# Patient Record
Sex: Male | Born: 1995 | Hispanic: Yes | Marital: Married | Smoking: Never smoker
Health system: Southern US, Community
[De-identification: ages and names within clinical notes are randomized; demographics above are authoritative.]

## PROBLEM LIST (undated history)

## (undated) DIAGNOSIS — R011 Cardiac murmur, unspecified: Secondary | ICD-10-CM

## (undated) DIAGNOSIS — I456 Pre-excitation syndrome: Secondary | ICD-10-CM

## (undated) HISTORY — PX: TONSILLECTOMY: SUR1361

## (undated) HISTORY — PX: ELECTROPHYSIOLOGIC STUDY: SHX172A

## (undated) HISTORY — PX: HAND SURGERY: SHX662

---

## 2014-04-18 ENCOUNTER — Emergency Department: Payer: Self-pay | Admitting: Emergency Medicine

## 2014-04-18 LAB — COMPREHENSIVE METABOLIC PANEL
ALK PHOS: 92 U/L
ALT: 21 U/L
Albumin: 4.7 g/dL (ref 3.8–5.6)
Anion Gap: 7 (ref 7–16)
BILIRUBIN TOTAL: 0.6 mg/dL (ref 0.2–1.0)
BUN: 11 mg/dL (ref 7–18)
Calcium, Total: 8.8 mg/dL — ABNORMAL LOW (ref 9.0–10.7)
Chloride: 106 mmol/L (ref 98–107)
Co2: 26 mmol/L (ref 21–32)
Creatinine: 0.81 mg/dL (ref 0.60–1.30)
EGFR (African American): >60
Glucose: 123 mg/dL — ABNORMAL HIGH (ref 65–99)
Osmolality: 278 (ref 275–301)
POTASSIUM: 3.4 mmol/L — AB (ref 3.5–5.1)
SGOT(AST): 28 U/L (ref 10–41)
Sodium: 139 mmol/L (ref 136–145)
Total Protein: 8.3 g/dL (ref 6.4–8.6)

## 2014-04-18 LAB — CBC
HCT: 45.4 % (ref 40.0–52.0)
HGB: 15.1 g/dL (ref 13.0–18.0)
MCH: 29.3 pg (ref 26.0–34.0)
MCHC: 33.3 g/dL (ref 32.0–36.0)
MCV: 88 fL (ref 80–100)
Platelet: 236 10*3/uL (ref 150–440)
RBC: 5.16 10*6/uL (ref 4.40–5.90)
RDW: 13.4 % (ref 11.5–14.5)
WBC: 9.4 10*3/uL (ref 3.8–10.6)

## 2014-04-18 LAB — TROPONIN I: Troponin-I: <0.02 ng/mL

## 2014-08-02 ENCOUNTER — Emergency Department (HOSPITAL_COMMUNITY)
Admission: EM | Admit: 2014-08-02 | Discharge: 2014-08-02 | Disposition: A | Discharge status: Home / Self Care | Payer: Worker's Compensation | Attending: Emergency Medicine | Admitting: Emergency Medicine

## 2014-08-02 ENCOUNTER — Encounter (HOSPITAL_COMMUNITY): Payer: Self-pay | Admitting: Emergency Medicine

## 2014-08-02 ENCOUNTER — Emergency Department (HOSPITAL_COMMUNITY): Payer: Worker's Compensation

## 2014-08-02 DIAGNOSIS — Y9389 Activity, other specified: Secondary | ICD-10-CM | POA: Insufficient documentation

## 2014-08-02 DIAGNOSIS — S134XXA Sprain of ligaments of cervical spine, initial encounter: Secondary | ICD-10-CM | POA: Diagnosis not present

## 2014-08-02 DIAGNOSIS — R011 Cardiac murmur, unspecified: Secondary | ICD-10-CM | POA: Diagnosis not present

## 2014-08-02 DIAGNOSIS — S199XXA Unspecified injury of neck, initial encounter: Secondary | ICD-10-CM

## 2014-08-02 DIAGNOSIS — W1839XA Other fall on same level, initial encounter: Secondary | ICD-10-CM | POA: Diagnosis not present

## 2014-08-02 DIAGNOSIS — Y9289 Other specified places as the place of occurrence of the external cause: Secondary | ICD-10-CM | POA: Diagnosis not present

## 2014-08-02 DIAGNOSIS — Y998 Other external cause status: Secondary | ICD-10-CM | POA: Insufficient documentation

## 2014-08-02 HISTORY — DX: Cardiac murmur, unspecified: R01.1

## 2014-08-02 MED ORDER — CYCLOBENZAPRINE HCL 10 MG PO TABS: 10.0000 mg | ORAL_TABLET | Freq: Two times a day (BID) | ORAL | Status: DC | PRN

## 2014-08-02 MED ORDER — MORPHINE SULFATE 4 MG/ML IJ SOLN
4.0000 mg | Freq: Once | INTRAMUSCULAR | Status: AC
  Administered 2014-08-02: 4 mg via INTRAMUSCULAR
  Filled 2014-08-02: qty 1

## 2014-08-02 NOTE — ED Notes (Signed)
Pt comes via [facility] from urgent care, reports recliner from unknown height and landed on top of head.  Pt denies LOC, dizziness, N/V.  Pt states pain 7/10.  Pt arrived in c-collar on longboard, MD cleared, pt stated point tenderness on palpation of neck.  NAD noted at this time.

## 2014-08-02 NOTE — Discharge Instructions (Signed)
Cervical Sprain °A cervical sprain is an injury in the neck in which the strong, fibrous tissues (ligaments) that connect your neck bones stretch or tear. Cervical sprains can range from mild to severe. Severe cervical sprains can cause the neck vertebrae to be unstable. This can lead to damage of the spinal cord and can result in serious nervous system problems. The amount of time it takes for a cervical sprain to get better depends on the cause and extent of the injury. Most cervical sprains heal in 1 to 3 weeks. °CAUSES  °Severe cervical sprains may be caused by:  °· Contact sport injuries (such as from football, rugby, wrestling, hockey, auto racing, gymnastics, diving, martial arts, or boxing).   °· Motor vehicle collisions.   °· Whiplash injuries. This is an injury from a sudden forward and backward whipping movement of the head and neck.  °· Falls.   °Mild cervical sprains may be caused by:  °· Being in an awkward position, such as while cradling a telephone between your ear and shoulder.   °· Sitting in a chair that does not offer proper support.   °· Working at a poorly designed computer station.   °· Looking up or down for long periods of time.   °SYMPTOMS  °· Pain, soreness, stiffness, or a burning sensation in the front, back, or sides of the neck. This discomfort may develop immediately after the injury or slowly, 24 hours or more after the injury.   °· Pain or tenderness directly in the middle of the back of the neck.   °· Shoulder or upper back pain.   °· Limited ability to move the neck.   °· Headache.   °· Dizziness.   °· Weakness, numbness, or tingling in the hands or arms.   °· Muscle spasms.   °· Difficulty swallowing or chewing.   °· Tenderness and swelling of the neck.   °DIAGNOSIS  °Most of the time your health care provider can diagnose a cervical sprain by taking your history and doing a physical exam. Your health care provider will ask about previous neck injuries and any known neck  problems, such as arthritis in the neck. X-rays may be taken to find out if there are any other problems, such as with the bones of the neck. Other tests, such as a CT scan or MRI, may also be needed.  °TREATMENT  °Treatment depends on the severity of the cervical sprain. Mild sprains can be treated with rest, keeping the neck in place (immobilization), and pain medicines. Severe cervical sprains are immediately immobilized. Further treatment is done to help with pain, muscle spasms, and other symptoms and may include: °· Medicines, such as pain relievers, numbing medicines, or muscle relaxants.   °· Physical therapy. This may involve stretching exercises, strengthening exercises, and posture training. Exercises and improved posture can help stabilize the neck, strengthen muscles, and help stop symptoms from returning.   °HOME CARE INSTRUCTIONS  °· Put ice on the injured area.   °¨ Put ice in a plastic bag.   °¨ Place a towel between your skin and the bag.   °¨ Leave the ice on for 15-20 minutes, 3-4 times a day.   °· If your injury was severe, you may have been given a cervical collar to wear. A cervical collar is a two-piece collar designed to keep your neck from moving while it heals. °¨ Do not remove the collar unless instructed by your health care provider. °¨ If you have long hair, keep it outside of the collar. °¨ Ask your health care provider before making any adjustments to your collar. Minor   adjustments may be required over time to improve comfort and reduce pressure on your chin or on the back of your head.  Ifyou are allowed to remove the collar for cleaning or bathing, follow your health care provider's instructions on how to do so safely.  Keep your collar clean by wiping it with mild soap and water and drying it completely. If the collar you have been given includes removable pads, remove them every 1-2 days and hand wash them with soap and water. Allow them to air dry. They should be completely  dry before you wear them in the collar.  If you are allowed to remove the collar for cleaning and bathing, wash and dry the skin of your neck. Check your skin for irritation or sores. If you see any, tell your health care provider.  Do not drive while wearing the collar.   Only take over-the-counter or prescription medicines for pain, discomfort, or fever as directed by your health care provider.   Keep all follow-up appointments as directed by your health care provider.   Keep all physical therapy appointments as directed by your health care provider.   Make any needed adjustments to your workstation to promote good posture.   Avoid positions and activities that make your symptoms worse.   Warm up and stretch before being active to help prevent problems.  SEEK MEDICAL CARE IF:   Your pain is not controlled with medicine.   You are unable to decrease your pain medicine over time as planned.   Your activity level is not improving as expected.  SEEK IMMEDIATE MEDICAL CARE IF:   You develop any bleeding.  You develop stomach upset.  You have signs of an allergic reaction to your medicine.   Your symptoms get worse.   You develop new, unexplained symptoms.   You have numbness, tingling, weakness, or paralysis in any part of your body.  MAKE SURE YOU:   Understand these instructions.  Will watch your condition.  Will get help right away if you are not doing well or get worse. Document Released: 05/02/2007 Document Revised: 07/10/2013 Document Reviewed: 01/10/2013 [facility] Patient Information 2015 [place], [place]. This information is not intended to replace advice given to you by your health care provider. Make sure you discuss any questions you have with your health care provider.  Cervical Collar A cervical collar is used to hold the head and neck still. This may be a soft, thick collar or a more rigid hard collar. This can keep your sore neck muscles from  hurting. The collar also protects you in case a more serious injury of the neck is present and can not be seen yet on an x-ray. FOLLOW THESE INSTRUCTIONS FOR COLLAR USE:  Attach the Velcro tab in back.  Make it tight enough to support part of the weight of your chin.  Do not make it so tight that it hurts or makes it hard to breathe.  Wear it until you are comfortable without it or as instructed. HOME CARE INSTRUCTIONS   Ice packs to the neck or areas of pain approximately 03-04 times a day for 15-20 minutes while awake. Do this for 2 days. Ask your physician if you may remove the cervical collar for bathing or ice.  Do not remove any collar unless instructed by a caregiver. Ask if the collar may be removed for showering or eating.  Only take over-the-counter or prescription medicines for pain, discomfort, or fever as directed by your caregiver.  Do not drive a car until given permission by your caregiver.  Follow all instructions for follow-up with your caregiver. This includes any referrals, physical therapy, and rehabilitation. Any delay in obtaining necessary care could result in a delay or failure of the injury to heal properly. SEEK IMMEDIATE MEDICAL CARE IF:   You develop increasing pain.  You develop problems using your arms or legs or have tingling or other funny feelings in them.  You develop loss of strength in either of your arms or legs or have difficulty walking.  You develop a fever or shaking chills.  You lose control of your bowels or bladder. MAKE SURE YOU:   Understand these instructions.  Will watch your condition.  Will get help right away if you are not doing well or get worse. Document Released: 03/27/2004 Document Revised: 09/27/2011 Document Reviewed: 02/26/2008 [facility] Patient Information 2015 [place]. This information is not intended to replace advice given to you by your health care provider. Make sure you discuss any questions you have  with your health care provider.

## 2014-08-02 NOTE — ED Provider Notes (Signed)
CSN: [id number]     Arrival date & time 08/02/14  1506 History   First MD Initiated Contact with Patient 08/02/14 1508     Chief Complaint  Patient presents with  . Neck Pain     (Consider location/radiation/quality/duration/timing/severity/associated sxs/prior Treatment) HPI Comments: Patient was moving furniture when a couch fell from approximately 2 feet above his head and landed directly on the top of his head.  He felt his neck compress toward his shoulders.    Patient is a 19 y.o. male presenting with neck pain. The history is provided by the patient. No language interpreter was used.  Neck Pain Pain location:  Generalized neck Quality:  Aching Pain radiates to:  Does not radiate Pain severity:  Moderate Pain is:  Same all the time Onset quality:  Sudden Duration:  5 hours Timing:  Constant Progression:  Unchanged Chronicity:  New Context comment:  Couch falling on head Relieved by:  Neck support Worsened by:  Twisting Ineffective treatments:  NSAIDs Associated symptoms: no bladder incontinence, no bowel incontinence, no fever, no headaches, no numbness and no weakness   Risk factors: no hx of head and neck radiation, no hx of spinal trauma and no recent head injury     Past Medical History  Diagnosis Date  . Heart murmur    Past Surgical History  Procedure Laterality Date  . Tonsillectomy    . Hand surgery Right     5th finger, 2012 ad 2013   No family history on file. History  Substance Use Topics  . Smoking status: Never Smoker   . Smokeless tobacco: Not on file  . Alcohol Use: No    Review of Systems  Constitutional: Negative for fever.  Gastrointestinal: Negative for bowel incontinence.  Genitourinary: Negative for bladder incontinence.  Musculoskeletal: Positive for neck pain.  Neurological: Negative for weakness, numbness and headaches.  All other systems reviewed and are negative.     Allergies  Review of patient's allergies indicates no  known allergies.  Home Medications   Prior to Admission medications   Medication Sig Start Date End Date Taking? Authorizing Provider  acetaminophen (TYLENOL) 325 MG tablet Take 650 mg by mouth every 6 (six) hours as needed for mild pain.   Yes Historical Provider, MD  cyclobenzaprine (FLEXERIL) 10 MG tablet Take 1 tablet (10 mg total) by mouth 2 (two) times daily as needed for muscle spasms. 08/02/14   Bethann Berkshire, MD   BP 107/59 mmHg  Pulse 75  Temp(Src) 98.4 F (36.9 C) (Oral)  Resp 16  Ht  (1.651 m)  Wt 112 lb (50.803 kg)  BMI 18.64 kg/m2  SpO2 98% Physical Exam  Constitutional: He is oriented to person, place, and time. He appears well-developed and well-nourished. No distress. Cervical collar and backboard in place.  HENT:  Head: Normocephalic.  Right Ear: Tympanic membrane normal.  Left Ear: Tympanic membrane normal.  Nose: No nasal deformity, septal deviation or nasal septal hematoma.  Eyes: Pupils are equal, round, and reactive to light.  Neck: No spinous process tenderness and no muscular tenderness present.  Cardiovascular: Normal rate, regular rhythm and normal heart sounds.   Pulmonary/Chest: Effort normal. No respiratory distress. He has no wheezes. He exhibits no tenderness, no laceration and no deformity.  Abdominal: Normal appearance. There is no tenderness. There is no rigidity, no rebound and no guarding.  Musculoskeletal:       Cervical back: He exhibits tenderness (C4-6). He exhibits no bony tenderness, no deformity, no  laceration and no pain.       Thoracic back: He exhibits no tenderness, no bony tenderness, no swelling, no deformity and no laceration.       Lumbar back: He exhibits no tenderness, no bony tenderness, no deformity and no laceration.  Neurological: He is alert and oriented to person, place, and time. He has normal strength. No cranial nerve deficit or sensory deficit. He displays no Babinski's sign on the right side. He displays no  Babinski's sign on the left side.  Strength 5/5 bilateral upper and lower extremities.  Sensation intact x4 extremities.    Skin: Skin is warm and dry. He is not diaphoretic.  Nursing note and vitals reviewed.   ED Course  Procedures (including critical care time) Labs Review Labs Reviewed - No data to display  Imaging Review Ct Cervical Spine Wo Contrast  08/02/2014   CLINICAL DATA:  Larey SeatFell backwards and struck head, neck tenderness  EXAM: CT CERVICAL SPINE WITHOUT CONTRAST  TECHNIQUE: Multidetector CT imaging of the cervical spine was performed without intravenous contrast. Multiplanar CT image reconstructions were also generated.  COMPARISON:  Cervical spine radiographs 07/29/2009  FINDINGS: Prevertebral soft tissues normal thickness.  Vertebral body and disc space heights maintained.  Osseous mineralization normal.  Visualized skullbase intact.  No acute fracture, subluxation or bone destruction.  Lung apices clear.  IMPRESSION: Normal exam.   Electronically Signed   By: Ulyses SouthwardMark  Boles M.D.   On: 08/02/2014 16:44     EKG Interpretation None      MDM   Final diagnoses:  Neck injury  Sprain of ligaments of cervical spine, initial encounter   Patient is a 19 year old Hispanic male with a pertinent past medical history who comes to the ED today as transfer from urgent care with concerns for potential C-spine injury. Physical exam as above. Patient does have C-spine tenderness as result of CT of the C-spine was obtained. He has no thoracic or lumbar spine tenderness. He has no weakness, numbness, or pathologic reflexes. Patient's mechanism of injury is concerning. CT of the C-spine demonstrated no acute fractures, subluxations, or soft tissue abnormalities. Patient was reevaluated after obtaining C-spine CT scan. He was still found to have midline tenderness and inability to rotate his neck 45 either direction. As a result he was unable to be cleared via [place] C-spine or nexus criteria.  Patient has no neurologic deficits as a result I do not feel he requires an emergent MRI at this time. He was placed into a [place] J collar and instructed to follow-up with his primary care physician or workman's comp physician in 2 weeks to obtain a repeat exam. He was instructed to return to the emergency department with weakness, numbness, bowel or bladder incontinence, or worsening pain. He was provided a prescription for Flexeril for muscle spasms. Patient was discharged in good condition imaging was reviewed by myself and consider medical decision-making. Imaging was interpreted by radiology. Care was discussed with attending Dr. Anitra LauthPlunkett.     Bethann BerkshireAaron Shreyansh Tiffany, MD 08/02/14 1711  Gwyneth SproutWhitney Plunkett, MD 08/02/14 2342

## 2018-05-04 DIAGNOSIS — I456 Pre-excitation syndrome: Secondary | ICD-10-CM | POA: Insufficient documentation

## 2018-09-03 ENCOUNTER — Other Ambulatory Visit: Payer: Self-pay

## 2018-09-03 ENCOUNTER — Emergency Department
Admission: EM | Admit: 2018-09-03 | Discharge: 2018-09-04 | Disposition: A | Discharge status: Home / Self Care | Payer: BC Managed Care – PPO | Attending: Emergency Medicine | Admitting: Emergency Medicine

## 2018-09-03 DIAGNOSIS — R531 Weakness: Secondary | ICD-10-CM | POA: Diagnosis present

## 2018-09-03 DIAGNOSIS — J45909 Unspecified asthma, uncomplicated: Secondary | ICD-10-CM | POA: Diagnosis not present

## 2018-09-03 DIAGNOSIS — R202 Paresthesia of skin: Secondary | ICD-10-CM | POA: Insufficient documentation

## 2018-09-03 DIAGNOSIS — M5412 Radiculopathy, cervical region: Secondary | ICD-10-CM

## 2018-09-03 MED ORDER — SODIUM CHLORIDE 0.9 % IV BOLUS
1000.0000 mL | Freq: Once | INTRAVENOUS | Status: AC
  Administered 2018-09-04: 1000 mL via INTRAVENOUS

## 2018-09-03 NOTE — ED Provider Notes (Signed)
[facility] Emergency Department Provider Note   ____________________________________________   First MD Initiated Contact with Patient 09/03/18 2342     (approximate)  I have reviewed the triage vital signs and the nursing notes.   HISTORY  Chief Complaint Numbness    HPI Henry Garcia is a 23 y.o. male who presents to the ED from home with a chief complaint of left upper extremity numbness, weakness and temperature change since 08/18/2018.   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  This was done at [facility].  Right groin was accessed.  Patient return to the ED the following day due to bleeding from the wound which was not actually bleeding.  Tells me since the day of the procedure, he has been experiencing occasional numbness, weakness and cold sensation in his left upper extremity.  There is no pattern to the symptoms.  He puts a blanket over his arm which then returns is warm to normal temperature.  He is right-hand dominant.  He has a postop follow-up appointment scheduled on March 17.  For some reason, patient has not called his cardiologist nor made an appointment for evaluation.  Tells me he figured he should just come to the ED for evaluation.  Denies recent fever, chills, headache, neck pain, vision changes, chest pain, shortness of breath, abdominal pain, nausea, vomiting.  Works as a [employer].   Past Medical History:  Diagnosis Date  . Heart murmur   . Acid reflux   . Arrhythmia   . Asthma   . Heart murmur   . Myocardial infarction (CMS-HCC)    2015    There are no active problems to display for this patient.   Past Surgical History:  Procedure Laterality Date  . HAND SURGERY Right    5th finger, 2012 ad 2013  . TONSILLECTOMY      Prior to Admission medications   Medication Sig Start Date End Date Taking? Authorizing Provider  acetaminophen (TYLENOL) 325 MG tablet Take 650 mg by mouth  every 6 (six) hours as needed for mild pain.    [provider]  cyclobenzaprine (FLEXERIL) 10 MG tablet Take 1 tablet (10 mg total) by mouth 2 (two) times daily as needed for muscle spasms. 08/02/14   Bethann Berkshire, MD    Allergies Amoxicillin; Clarithromycin; and Cortisone  No family history on file.  Social History Social History   Tobacco Use  . Smoking status: Never Smoker  Substance Use Topics  . Alcohol use: No  . Drug use: No    Review of Systems  Constitutional: No fever/chills Eyes: No visual changes. ENT: No sore throat. Cardiovascular: Denies chest pain. Respiratory: Denies shortness of breath. Gastrointestinal: No abdominal pain.  No nausea, no vomiting.  No diarrhea.  No constipation. Genitourinary: Negative for dysuria. Musculoskeletal: Positive for left upper extremity weakness, numbness and temperature change.  Negative for back pain. Skin: Negative for rash. Neurological: Negative for headaches, focal weakness or numbness.   ____________________________________________   PHYSICAL EXAM:  VITAL SIGNS: ED Triage Vitals  Enc Vitals Group     BP 09/03/18 2108 127/78     Pulse Rate 09/03/18 2108 85     Resp 09/03/18 2108 16     Temp 09/03/18 2108 98.3 F (36.8 C)     Temp Source 09/03/18 2108 Oral     SpO2 09/03/18 2108 99 %     Weight 09/03/18 2107 130 lb (59 kg)  Height 09/03/18 2107 5\' 5"  (1.651 m)     Head Circumference --      Peak Flow --      Pain Score 09/03/18 2106 6     Pain Loc --      Pain Edu? --      Excl. in [place]? --     Constitutional: Alert and oriented. Well appearing and in no acute distress. Eyes: Conjunctivae are normal. PERRL. EOMI. Head: Atraumatic. Nose: No congestion/rhinnorhea. Mouth/Throat: Mucous membranes are moist.  Oropharynx non-erythematous. Neck: No stridor.  No cervical spine tenderness to palpation.  Supple neck without meningismus.  No carotid bruits. Cardiovascular: Normal rate, regular rhythm.  Grossly normal heart sounds.  Good peripheral circulation. Respiratory: Normal respiratory effort.  No retractions. Lungs CTAB. Gastrointestinal: Soft and nontender. No distention. No abdominal bruits. No CVA tenderness. Musculoskeletal:  LUE: Full range of motion of shoulder, elbow and wrist.  No swelling to suggest DVT.  2+ radial pulse.  Brisk, less than 5-second capillary refill.  5/5 motor strength and sensation.  Symmetrically warm limbs without evidence for ischemia. No lower extremity tenderness nor edema.  No joint effusions. Neurologic:  Normal speech and language. No gross focal neurologic deficits are appreciated. No gait instability. Skin:  Skin is warm, dry and intact. No rash noted. Psychiatric: Mood and affect are normal. Speech and behavior are normal.  ____________________________________________   LABS (all labs ordered are listed, but only abnormal results are displayed)  Labs Reviewed  BASIC METABOLIC PANEL - Abnormal; Notable for the following components:      Result Value   Glucose, Bld 107 (*)    All other components within normal limits  CBC WITH DIFFERENTIAL/PLATELET  TROPONIN I   ____________________________________________  EKG  ED ECG REPORT I, Derriana Oser J, the attending physician, personally viewed and interpreted this ECG.   Date: 09/04/2018  EKG Time: 2109  Rate: 84  Rhythm: normal EKG, normal sinus rhythm  Axis: Normal  Intervals:Short PR  ST&T Change: Nonspecific  ____________________________________________  RADIOLOGY  ED MD interpretation: No acute cardiopulmonary process; CTA neck and left upper extremity unremarkable  Official radiology report(s): Dg Chest 2 View  Result Date: 09/04/2018 CLINICAL DATA:  23 year old male status post cardiac ablation for Wolff-Parkinson-White 2 weeks ago. Left arm pain. EXAM: CHEST - 2 VIEW COMPARISON:  CTA chest 11/29/2017 and earlier. FINDINGS: Lung volumes and mediastinal contours remain normal.  Visualized tracheal air column is within normal limits. Both lungs appear stable and clear. No pneumothorax or pleural effusion. No osseous abnormality identified. Negative visible bowel gas pattern. IMPRESSION: Stable and negative radiographic appearance of the chest. Electronically Signed   By: Odessa Fleming M.D.   On: 09/04/2018 00:24   Ct Angio Neck W And/or Wo Contrast  Result Date: 09/04/2018 CLINICAL DATA:  Initial evaluation for intermittent left arm numbness since cardiac ablation on 08/17/2018. EXAM: CT ANGIOGRAPHY NECK TECHNIQUE: Multidetector CT imaging of the neck was performed using the standard protocol during bolus administration of intravenous contrast. Multiplanar CT image reconstructions and MIPs were obtained to evaluate the vascular anatomy. Carotid stenosis measurements (when applicable) are obtained utilizing NASCET criteria, using the distal internal carotid diameter as the denominator. CONTRAST:  ISOVUE-370 IOPAMIDOL (ISOVUE-370) INJECTION 76% COMPARISON:  None. FINDINGS: Aortic arch: Visualized aortic arch of normal caliber with normal branch pattern. No stenosis or other abnormality about the origin of the great vessels. Visualized subclavian arteries widely patent and normal. Right carotid system: Right common and internal carotid arteries widely patent  without stenosis, dissection, or occlusion. Right external carotid artery and its branches within normal limits. Left carotid system: Left common and internal carotid arteries widely patent without stenosis, dissection, or occlusion. Left external carotid artery and its branches within normal limits. Vertebral arteries: Both of the vertebral arteries arise from the subclavian arteries. Vertebral arteries widely patent within the neck without stenosis, dissection, or occlusion. Skeleton: No acute osseous abnormality. No discrete lytic or blastic osseous lesions. Other neck: No other acute soft tissue abnormality within the neck. No  adenopathy. Salivary glands normal. Thyroid normal. Upper chest: Visualized upper chest demonstrates no acute finding. Partially visualized lungs are clear. IMPRESSION: Normal CTA of the neck with no acute vascular abnormality identified. Electronically Signed   By: Rise Mu M.D.   On: 09/04/2018 01:52   Ct Angio Up Extrem Left W &/or Wo Contast  Result Date: 09/04/2018 CLINICAL DATA:  Left arm numbness off and on since heart surgery on 08/18/2018. EXAM: CT ANGIOGRAPHY UPPER LEFT EXTREMITY TECHNIQUE: CT of the left upper extremity aortic arch through left hand with reformats. CONTRAST:  ISOVUE-370 IOPAMIDOL (ISOVUE-370) INJECTION 76% COMPARISON:  None. FINDINGS: Satisfactory opacification of aortic arch without aneurysmal dilatation or dissection. No significant stenosis is noted of the left subclavian, axillary and brachial arteries. The bifurcation of the brachial artery is unremarkable with visualization of the radial, ulnar and interosseous arteries to a least a mid forearm before timing of contrast obscures visualization of the distal radial and ulnar arteries. There is visualization the palmar arch. No significant stenosis, aneurysm or dissection. The included arm and forearm musculature and osseous elements are intact. Review of the MIP images confirms the above findings. IMPRESSION: Unremarkable CTA of the left upper extremity. Electronically Signed   By: Tollie Eth M.D.   On: 09/04/2018 01:42    ____________________________________________   PROCEDURES  Procedure(s) performed: None  Procedures  Critical Care performed: No  ____________________________________________   INITIAL IMPRESSION / ASSESSMENT AND PLAN / ED COURSE  As part of my medical decision making, I reviewed the following data within the electronic MEDICAL RECORD NUMBER History obtained from family, Nursing notes reviewed and incorporated, Labs reviewed, EKG interpreted, Old chart reviewed, Radiograph  reviewed and Notes from prior ED visits    23 year old male who presents with left upper extremity numbness, weakness and coldness status post EP procedure with ablation on 08/18/2018.  Differential diagnosis includes but is not limited to musculoskeletal, thoracic outlet syndrome, arterial occlusion, electrolyte imbalance, carotid dissection, etc.  Patient has a normal physical exam.  Tells me he is not having symptoms currently of numbness, weakness nor coldness.  Given his symptoms, will proceed with work-up to include lab work and CTA imaging of neck and left upper arm.   Clinical Course as of Sep 04 602  Mon Sep 04, 2018  0221 Updated patient on all results.  Currently he is not experiencing any discomfort in his left arm.  Will try him on Medrol Dosepak for some symptoms suggestive of cervical radiculopathy.  Do not feel this would trigger his WPW, especially status post ablation.  Strongly encouraged him to follow-up with his cardiologist next week.  Strict return precautions given.  Patient and family member verbalized understanding and agree with plan of care.   [JS]  0237 Noted cortisone listed as 1 of patient's allergies.  He describes having itching to cortisone injections.  But he has never had oral prednisone before.  Would hesitate to prescribe this in case he has a  reaction.  Will hold off for now.   [JS]    Clinical Course User Index [JS] Irean HongSung, Sanjit Mcmichael J, MD     ____________________________________________   FINAL CLINICAL IMPRESSION(S) / ED DIAGNOSES  Final diagnoses:  Paresthesia  Cervical radiculopathy     ED Discharge Orders    None       Note:  This document was prepared using Dragon voice recognition software and may include unintentional dictation errors.   Irean HongSung, Izamar Linden J, MD 09/04/18 [id number]

## 2018-09-03 NOTE — ED Notes (Signed)
Spoke with Dr. Derrill Kay regarding patient, no further orders at this time.

## 2018-09-03 NOTE — ED Triage Notes (Signed)
Reports left arm numbness off/on since heart surgery  August 18 2018.  Patient stated told his cardiologist and was told it was normal.

## 2018-09-04 ENCOUNTER — Emergency Department: Payer: BC Managed Care – PPO

## 2018-09-04 ENCOUNTER — Encounter: Payer: Self-pay | Admitting: Radiology

## 2018-09-04 LAB — CBC WITH DIFFERENTIAL/PLATELET
ABS IMMATURE GRANULOCYTES: 0.03 10*3/uL (ref 0.00–0.07)
Basophils Absolute: 0 10*3/uL (ref 0.0–0.1)
Basophils Relative: 1 %
Eosinophils Absolute: 0.3 10*3/uL (ref 0.0–0.5)
Eosinophils Relative: 3 %
HEMATOCRIT: 40.1 % (ref 39.0–52.0)
Hemoglobin: 14 g/dL (ref 13.0–17.0)
Immature Granulocytes: 0 %
LYMPHS PCT: 32 %
Lymphs Abs: 2.8 10*3/uL (ref 0.7–4.0)
MCH: 29.5 pg (ref 26.0–34.0)
MCHC: 34.9 g/dL (ref 30.0–36.0)
MCV: 84.6 fL (ref 80.0–100.0)
MONOS PCT: 10 %
Monocytes Absolute: 0.9 10*3/uL (ref 0.1–1.0)
NEUTROS PCT: 54 %
Neutro Abs: 4.7 10*3/uL (ref 1.7–7.7)
PLATELETS: 266 10*3/uL (ref 150–400)
RBC: 4.74 MIL/uL (ref 4.22–5.81)
RDW: 12.7 % (ref 11.5–15.5)
WBC: 8.8 10*3/uL (ref 4.0–10.5)
nRBC: 0 % (ref 0.0–0.2)

## 2018-09-04 LAB — BASIC METABOLIC PANEL
ANION GAP: 8 (ref 5–15)
BUN: 16 mg/dL (ref 6–20)
CO2: 26 mmol/L (ref 22–32)
Calcium: 9.4 mg/dL (ref 8.9–10.3)
Chloride: 105 mmol/L (ref 98–111)
Creatinine, Ser: 0.66 mg/dL (ref 0.61–1.24)
GFR calc Af Amer: >60 mL/min (ref >60)
GFR calc non Af Amer: >60 mL/min (ref >60)
GLUCOSE: 107 mg/dL — AB (ref 70–99)
Potassium: 3.6 mmol/L (ref 3.5–5.1)
Sodium: 139 mmol/L (ref 135–145)

## 2018-09-04 LAB — TROPONIN I: Troponin I: <0.03 ng/mL (ref <0.03)

## 2018-09-04 MED ORDER — IOPAMIDOL (ISOVUE-370) INJECTION 76%
120.0000 mL | Freq: Once | INTRAVENOUS | Status: AC | PRN
  Administered 2018-09-04: 120 mL via INTRAVENOUS

## 2018-09-04 MED ORDER — IOHEXOL 350 MG/ML SOLN: 120.0000 mL | Freq: Once | INTRAVENOUS | Status: DC | PRN

## 2018-09-04 NOTE — ED Notes (Signed)
Patient returned from CT

## 2018-09-04 NOTE — Discharge Instructions (Addendum)
You may take Tylenol and/or Ibuprofen as needed for discomfort.  Return to the ER for recurrent or worsening symptoms, persistent vomiting, difficulty breathing or other concerns.

## 2019-01-05 ENCOUNTER — Emergency Department
Admission: EM | Admit: 2019-01-05 | Discharge: 2019-01-05 | Disposition: A | Discharge status: Home / Self Care | Payer: BC Managed Care – PPO | Attending: Emergency Medicine | Admitting: Emergency Medicine

## 2019-01-05 ENCOUNTER — Encounter: Payer: Self-pay | Admitting: Emergency Medicine

## 2019-01-05 ENCOUNTER — Other Ambulatory Visit: Payer: Self-pay

## 2019-01-05 DIAGNOSIS — R21 Rash and other nonspecific skin eruption: Secondary | ICD-10-CM | POA: Diagnosis present

## 2019-01-05 DIAGNOSIS — L259 Unspecified contact dermatitis, unspecified cause: Secondary | ICD-10-CM | POA: Insufficient documentation

## 2019-01-05 HISTORY — DX: Pre-excitation syndrome: I45.6

## 2019-01-05 MED ORDER — PREDNISONE 10 MG (21) PO TBPK: ORAL_TABLET | ORAL | 0 refills | Status: DC

## 2019-01-05 MED ORDER — TRIAMCINOLONE ACETONIDE 0.025 % EX OINT: 1.0000 "application " | TOPICAL_OINTMENT | Freq: Two times a day (BID) | CUTANEOUS | 0 refills | Status: DC

## 2019-01-05 MED ORDER — TRIAMCINOLONE ACETONIDE 0.025 % EX OINT: 1.0000 "application " | TOPICAL_OINTMENT | Freq: Two times a day (BID) | CUTANEOUS | 0 refills | Status: AC

## 2019-01-05 NOTE — ED Provider Notes (Signed)
[facility] Emergency Department Provider Note  ____________________________________________  Time seen: Approximately 4:42 PM  I have reviewed the triage vital signs and the nursing notes.   HISTORY  Chief Complaint Rash    HPI Henry Garcia is a 23 y.o. male presents to the emergency department with a pruritic rash of the upper extremities and lower extremities that started after patient was mowing grass.  Patient states that he has been diagnosed with contact dermatitis in the past and took prednisone as well as "some sort of cream" that helped his symptoms resolve.  Patient denies chest pain, chest tightness, shortness of breath, abdominal pain or vomiting.  Patient is speaking in complete sentences in exam room and appears to be resting comfortably.  No other alleviating measures have been attempted.        Past Medical History:  Diagnosis Date  . Heart murmur   . Wolff-Parkinson-White syndrome     There are no active problems to display for this patient.   Past Surgical History:  Procedure Laterality Date  . HAND SURGERY Right    5th finger, 2012 ad 2013  . TONSILLECTOMY      Prior to Admission medications   Medication Sig Start Date End Date Taking? Authorizing Provider  acetaminophen (TYLENOL) 325 MG tablet Take 650 mg by mouth every 6 (six) hours as needed for mild pain.    [provider]  cyclobenzaprine (FLEXERIL) 10 MG tablet Take 1 tablet (10 mg total) by mouth 2 (two) times daily as needed for muscle spasms. 08/02/14   Katheren Shams, MD  predniSONE (STERAPRED UNI-PAK 21 TAB) 10 MG (21) TBPK tablet Take 6 tablets the first day, take 5 tablets the second day, take 4 tablets the third day, take 3 tablets the fourth day, take 2 tablets the fifth day, take 1 tablet the sixth day. 01/05/19   Lannie Fields, PA-C  triamcinolone (KENALOG) 0.025 % ointment Apply 1 application topically 2 (two) times daily for 5 days. 01/05/19 01/10/19   Lannie Fields, PA-C    Allergies Amoxicillin, Clarithromycin, and Cortisone  History reviewed. No pertinent family history.  Social History Social History   Tobacco Use  . Smoking status: Never Smoker  . Smokeless tobacco: Never Used  Substance Use Topics  . Alcohol use: No  . Drug use: No     Review of Systems  Constitutional: No fever/chills Eyes: No visual changes. No discharge ENT: No upper respiratory complaints. Cardiovascular: no chest pain. Respiratory: no cough. No SOB. Gastrointestinal: No abdominal pain.  No nausea, no vomiting.  No diarrhea.  No constipation. Genitourinary: Negative for dysuria. No hematuria Musculoskeletal: Negative for musculoskeletal pain. Skin: Patient has pruritic rash of upper and lower extremities.  Neurological: Negative for headaches, focal weakness or numbness.  ____________________________________________   PHYSICAL EXAM:  VITAL SIGNS: ED Triage Vitals [01/05/19 1317]  Enc Vitals Group     BP (!) 110/91     Pulse Rate 72     Resp 18     Temp 98.6 F (37 C)     Temp Source Oral     SpO2 100 %     Weight      Height 5\' 5"  (1.651 m)     Head Circumference      Peak Flow      Pain Score 0     Pain Loc      Pain Edu?      Excl. in [place]?  Constitutional: Alert and oriented. Well appearing and in no acute distress. Eyes: Conjunctivae are normal. PERRL. EOMI. Head: Atraumatic. Cardiovascular: Normal rate, regular rhythm. Normal S1 and S2.  Good peripheral circulation. Respiratory: Normal respiratory effort without tachypnea or retractions. Lungs CTAB. Good air entry to the bases with no decreased or absent breath sounds. Musculoskeletal: Full range of motion to all extremities. No gross deformities appreciated. Neurologic:  Normal speech and language. No gross focal neurologic deficits are appreciated.  Skin: Patient has irregularly distributed maculopapular rash of upper extremities and lower extremities.  No  vesicle formation or crusting. Psychiatric: Mood and affect are normal. Speech and behavior are normal. Patient exhibits appropriate insight and judgement.   ____________________________________________   LABS (all labs ordered are listed, but only abnormal results are displayed)  Labs Reviewed - No data to display ____________________________________________  EKG   ____________________________________________  RADIOLOGY   No results found.  ____________________________________________    PROCEDURES  Procedure(s) performed:    Procedures    Medications - No data to display   ____________________________________________   INITIAL IMPRESSION / ASSESSMENT AND PLAN / ED COURSE  Pertinent labs & imaging results that were available during my care of the patient were reviewed by me and considered in my medical decision making (see chart for details).  Review of the [place] CSRS was performed in accordance of the [facility] prior to dispensing any controlled drugs.         Assessment and plan Rash Patient presents to the emergency department with a pruritic, maculopapular rash of the upper extremities and lower extremities for the past 2 to 3 days.  On physical exam, patient was resting comfortably.  Vital signs were reassuring in the emergency department.  Rash is maculopapular in appearance without vesicles, overlying crusting, streaking or signs of excoriation.  Contact dermatitis is likely at this time.  Patient was treated with tapered prednisone and triamcinolone ointment.  All patient questions were answered.    ____________________________________________  FINAL CLINICAL IMPRESSION(S) / ED DIAGNOSES  Final diagnoses:  Contact dermatitis, unspecified contact dermatitis type, unspecified trigger      NEW MEDICATIONS STARTED DURING THIS VISIT:  ED Discharge Orders         Ordered    predniSONE (STERAPRED UNI-PAK 21 TAB) 10 MG (21) TBPK tablet     01/05/19  1640    triamcinolone (KENALOG) 0.025 % ointment  2 times daily     01/05/19 1640              This chart was dictated using voice recognition software/Dragon. Despite best efforts to proofread, errors can occur which can change the meaning. Any change was purely unintentional.    Orvil FeilWoods, Jaclyn M, PA-C 01/05/19 1646    Sharyn CreamerQuale, Mark, MD 01/05/19 [phone]

## 2019-01-05 NOTE — ED Triage Notes (Signed)
Pt to ED with c/o of rash to hands, arms and ankles.

## 2019-03-16 ENCOUNTER — Encounter: Payer: Self-pay | Admitting: *Deleted

## 2019-03-16 ENCOUNTER — Other Ambulatory Visit: Payer: Self-pay

## 2019-03-16 DIAGNOSIS — Z5321 Procedure and treatment not carried out due to patient leaving prior to being seen by health care provider: Secondary | ICD-10-CM | POA: Insufficient documentation

## 2019-03-16 DIAGNOSIS — R000 Tachycardia, unspecified: Secondary | ICD-10-CM | POA: Insufficient documentation

## 2019-03-16 LAB — TROPONIN I (HIGH SENSITIVITY): Troponin I (High Sensitivity): <2 ng/L (ref <18)

## 2019-03-16 LAB — CBC WITH DIFFERENTIAL/PLATELET
Abs Immature Granulocytes: 0.02 10*3/uL (ref 0.00–0.07)
Basophils Absolute: 0.1 10*3/uL (ref 0.0–0.1)
Basophils Relative: 1 %
Eosinophils Absolute: 0.3 10*3/uL (ref 0.0–0.5)
Eosinophils Relative: 4 %
HCT: 39.6 % (ref 39.0–52.0)
Hemoglobin: 13.8 g/dL (ref 13.0–17.0)
Immature Granulocytes: 0 %
Lymphocytes Relative: 19 %
Lymphs Abs: 1.6 10*3/uL (ref 0.7–4.0)
MCH: 29.8 pg (ref 26.0–34.0)
MCHC: 34.8 g/dL (ref 30.0–36.0)
MCV: 85.5 fL (ref 80.0–100.0)
Monocytes Absolute: 0.8 10*3/uL (ref 0.1–1.0)
Monocytes Relative: 9 %
Neutro Abs: 5.7 10*3/uL (ref 1.7–7.7)
Neutrophils Relative %: 67 %
Platelets: 266 10*3/uL (ref 150–400)
RBC: 4.63 MIL/uL (ref 4.22–5.81)
RDW: 12.3 % (ref 11.5–15.5)
WBC: 8.5 10*3/uL (ref 4.0–10.5)
nRBC: 0 % (ref 0.0–0.2)

## 2019-03-16 LAB — BASIC METABOLIC PANEL
Anion gap: 10 (ref 5–15)
BUN: 19 mg/dL (ref 6–20)
CO2: 24 mmol/L (ref 22–32)
Calcium: 8.9 mg/dL (ref 8.9–10.3)
Chloride: 102 mmol/L (ref 98–111)
Creatinine, Ser: 0.76 mg/dL (ref 0.61–1.24)
GFR calc Af Amer: >60 mL/min (ref >60)
GFR calc non Af Amer: >60 mL/min (ref >60)
Glucose, Bld: 115 mg/dL — ABNORMAL HIGH (ref 70–99)
Potassium: 3.7 mmol/L (ref 3.5–5.1)
Sodium: 136 mmol/L (ref 135–145)

## 2019-03-16 NOTE — ED Notes (Signed)
Pt arrives via [facility] with c/o SOB today. Per EMS, pt was 99% with clear lungs RA.
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
## 2019-03-17 ENCOUNTER — Emergency Department
Admission: EM | Admit: 2019-03-17 | Discharge: 2019-03-17 | Disposition: A | Discharge status: Home / Self Care | Payer: BC Managed Care – PPO | Attending: Emergency Medicine | Admitting: Emergency Medicine

## 2019-03-19 ENCOUNTER — Telehealth: Payer: Self-pay | Admitting: Emergency Medicine

## 2019-03-19 NOTE — Telephone Encounter (Signed)
Called patient due to lwot to inquire about condition and follow up plans. Says he feels better now.  He does have a doctor at [facility].  I told him he can let them know what happened and that he has results they can ollk at.

## 2019-04-01 ENCOUNTER — Emergency Department
Admission: EM | Admit: 2019-04-01 | Discharge: 2019-04-01 | Disposition: A | Discharge status: Home / Self Care | Payer: BC Managed Care – PPO | Attending: Emergency Medicine | Admitting: Emergency Medicine

## 2019-04-01 ENCOUNTER — Other Ambulatory Visit: Payer: Self-pay

## 2019-04-01 ENCOUNTER — Emergency Department: Payer: BC Managed Care – PPO

## 2019-04-01 DIAGNOSIS — M5410 Radiculopathy, site unspecified: Secondary | ICD-10-CM | POA: Insufficient documentation

## 2019-04-01 DIAGNOSIS — M25511 Pain in right shoulder: Secondary | ICD-10-CM | POA: Diagnosis present

## 2019-04-01 MED ORDER — PREDNISONE 10 MG (21) PO TBPK: ORAL_TABLET | ORAL | 0 refills | Status: DC

## 2019-04-01 MED ORDER — GABAPENTIN 300 MG PO CAPS: 300.0000 mg | ORAL_CAPSULE | Freq: Three times a day (TID) | ORAL | 0 refills | Status: DC | PRN

## 2019-04-01 NOTE — ED Provider Notes (Signed)
[facility] Emergency Department Provider Note  ____________________________________________   I have reviewed the triage vital signs and the nursing notes.   HISTORY  Chief Complaint Shoulder Pain   History limited by: Not Limited   HPI Henry Garcia is a 23 y.o. male who presents to the emergency department today with concerns for right shoulder and right arm pain.  He states that the pain has been present over the past week.  He has had and now 3 distinct episodes.  He describes the pain as the sensation of needles.  He feels like it is hard for him to move his shoulder or his arm.  The pain does radiate down into his hand.  He states he has history of a neck injury a number of years ago when a piece of furniture hit him in the back of his head.  The patient denies any other numbness or pain in any other extremity.  Denies any fevers. No recent trauma.    Records reviewed. 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 Heart murmur   . Wolff-Parkinson-White syndrome     There are no active problems to display for this patient.   Past Surgical History:  Procedure Laterality Date  . ELECTROPHYSIOLOGIC STUDY     08/2018  . HAND SURGERY Right    5th finger, 2012 ad 2013  . TONSILLECTOMY      Prior to Admission medications   Medication Sig Start Date End Date Taking? Authorizing Provider  acetaminophen (TYLENOL) 325 MG tablet Take 650 mg by mouth every 6 (six) hours as needed for mild pain.    [provider]  cyclobenzaprine (FLEXERIL) 10 MG tablet Take 1 tablet (10 mg total) by mouth 2 (two) times daily as needed for muscle spasms. 08/02/14   Bethann BerkshireSchmitt, Aaron, MD  gabapentin (NEURONTIN) 300 MG capsule Take 1 capsule (300 mg total) by mouth 3 (three) times daily as needed (Pain). 04/01/19 03/31/20  Phineas SemenGoodman, Denis Carreon, MD  predniSONE (STERAPRED UNI-PAK 21 TAB) 10 MG (21) TBPK tablet Take 6 tablets  the first day, take 5 tablets the second day, take 4 tablets the third day, take 3 tablets the fourth day, take 2 tablets the fifth day, take 1 tablet the sixth day. 01/05/19   Orvil FeilWoods, Jaclyn M, PA-C  predniSONE (STERAPRED UNI-PAK 21 TAB) 10 MG (21) TBPK tablet Per packaging instructions 04/01/19   Phineas SemenGoodman, Kaan Tosh, MD    Allergies Amoxicillin, Clarithromycin, and Cortisone  No family history on file.  Social History Social History   Tobacco Use  . Smoking status: Never Smoker  . Smokeless tobacco: Never Used  Substance Use Topics  . Alcohol use: Yes    Comment: weekends  . Drug use: No    Review of Systems Constitutional: No fever/chills Eyes: No visual changes. ENT: No sore throat. Cardiovascular: Denies chest pain. Respiratory: Denies shortness of breath. Gastrointestinal: No abdominal pain.  No nausea, no vomiting.  No diarrhea.   Genitourinary: Negative for dysuria. Musculoskeletal: Positive for right shoulder and right arm pain Skin: Negative for rash. Neurological: Negative for headaches, focal weakness or numbness.  ____________________________________________   PHYSICAL EXAM:  VITAL SIGNS: ED Triage Vitals  Enc Vitals Group     BP 04/01/19 0417 122/74     Pulse Rate 04/01/19 0417 86     Resp 04/01/19 0417 18     Temp 04/01/19 0417 98.4 F (36.9 C)  Temp Source 04/01/19 0417 Oral     SpO2 04/01/19 0417 98 %     Weight 04/01/19 0418 140 lb (63.5 kg)     Height 04/01/19 0418 5\' 5"  (1.651 m)     Head Circumference --      Peak Flow --      Pain Score 04/01/19 0418 8   Constitutional: Alert and oriented.  Eyes: Conjunctivae are normal.  ENT      Head: Normocephalic and atraumatic.      Nose: No congestion/rhinnorhea.      Mouth/Throat: Mucous membranes are moist.      Neck: No stridor. Hematological/Lymphatic/Immunilogical: No cervical lymphadenopathy. Cardiovascular: Normal rate, regular rhythm.  No murmurs, rubs, or gallops.  Respiratory: Normal  respiratory effort without tachypnea nor retractions. Breath sounds are clear and equal bilaterally. No wheezes/rales/rhonchi. Gastrointestinal: Soft and non tender. No rebound. No guarding.  Genitourinary: Deferred Musculoskeletal: Slight tenderness to palpation of the right shoulder. Radial and Ulnar pulse 2+. ROM slightly limited secondary to pain. Neurologic:  Normal speech and language. No gross focal neurologic deficits are appreciated.  Skin:  Skin is warm, dry and intact. No rash noted. Psychiatric: Mood and affect are normal. Speech and behavior are normal. Patient exhibits appropriate insight and judgment.  ____________________________________________    LABS (pertinent positives/negatives)  None  ____________________________________________   EKG  None  ____________________________________________    RADIOLOGY  Right should No osseous abnormality  ____________________________________________   PROCEDURES  Procedures  ____________________________________________   INITIAL IMPRESSION / ASSESSMENT AND PLAN / ED COURSE  Pertinent labs & imaging results that were available during my care of the patient were reviewed by me and considered in my medical decision making (see chart for details).   Patient presented to the emergency department today because of concerns for right shoulder right arm pain.  He describes as a sensation of needles.  On exam he does have some tenderness to palpation around the right shoulder.  Does have history of a neck injury.  The description of the pain being needlelike and shooting down his arm does make me question if he is suffering from cervical radiculopathy.  Pulses are good in that arm.  Did discuss this with the patient.  Will trial patient on prednisone and gabapentin.  Discussed with patient portance of follow-up.  ____________________________________________   FINAL CLINICAL IMPRESSION(S) / ED DIAGNOSES  Final diagnoses:   Acute pain of right shoulder  Radiculopathy, unspecified spinal region     Note: This dictation was prepared with Dragon dictation. Any transcriptional errors that result from this process are unintentional     Nance Pear, MD 04/01/19 0740

## 2019-04-01 NOTE — Discharge Instructions (Addendum)
Please seek medical attention for any high fevers, chest pain, shortness of breath, change in behavior, persistent vomiting, bloody stool or any other new or concerning symptoms.  

## 2019-04-01 NOTE — ED Notes (Signed)
Pt with right shoulder pain over the last week, "needling" started yesterday and hot feeling, tylenol taken yesterday with minimal effectiveness, denies trying IBU   Pt with CMS intact to both hands, warm and same color  Pt reports neck injury approx 6 years ago

## 2019-04-01 NOTE — ED Triage Notes (Signed)
Patient presents with right shoulder pain since yesterday. Denies injury, states he just woke up that way. Works in [employer].

## 2019-09-19 ENCOUNTER — Other Ambulatory Visit: Payer: Self-pay

## 2019-09-19 ENCOUNTER — Emergency Department
Admission: EM | Admit: 2019-09-19 | Discharge: 2019-09-19 | Disposition: A | Discharge status: Home / Self Care | Payer: BC Managed Care – PPO | Attending: Emergency Medicine | Admitting: Emergency Medicine

## 2019-09-19 DIAGNOSIS — F10920 Alcohol use, unspecified with intoxication, uncomplicated: Secondary | ICD-10-CM | POA: Diagnosis not present

## 2019-09-19 DIAGNOSIS — Z79899 Other long term (current) drug therapy: Secondary | ICD-10-CM | POA: Diagnosis not present

## 2019-09-19 DIAGNOSIS — Y900 Blood alcohol level of less than 20 mg/100 ml: Secondary | ICD-10-CM | POA: Insufficient documentation

## 2019-09-19 DIAGNOSIS — R1110 Vomiting, unspecified: Secondary | ICD-10-CM | POA: Diagnosis present

## 2019-09-19 LAB — COMPREHENSIVE METABOLIC PANEL
ALT: 17 U/L (ref 0–44)
AST: 19 U/L (ref 15–41)
Albumin: 5.1 g/dL — ABNORMAL HIGH (ref 3.5–5.0)
Alkaline Phosphatase: 78 U/L (ref 38–126)
Anion gap: 12 (ref 5–15)
BUN: 16 mg/dL (ref 6–20)
CO2: 24 mmol/L (ref 22–32)
Calcium: 9.6 mg/dL (ref 8.9–10.3)
Chloride: 103 mmol/L (ref 98–111)
Creatinine, Ser: 0.71 mg/dL (ref 0.61–1.24)
GFR calc Af Amer: >60 mL/min (ref >60)
GFR calc non Af Amer: >60 mL/min (ref >60)
Glucose, Bld: 104 mg/dL — ABNORMAL HIGH (ref 70–99)
Potassium: 3.9 mmol/L (ref 3.5–5.1)
Sodium: 139 mmol/L (ref 135–145)
Total Bilirubin: 0.6 mg/dL (ref 0.3–1.2)
Total Protein: 8.5 g/dL — ABNORMAL HIGH (ref 6.5–8.1)

## 2019-09-19 LAB — CBC
HCT: 44.1 % (ref 39.0–52.0)
Hemoglobin: 15.1 g/dL (ref 13.0–17.0)
MCH: 29.7 pg (ref 26.0–34.0)
MCHC: 34.2 g/dL (ref 30.0–36.0)
MCV: 86.6 fL (ref 80.0–100.0)
Platelets: 289 10*3/uL (ref 150–400)
RBC: 5.09 MIL/uL (ref 4.22–5.81)
RDW: 12.7 % (ref 11.5–15.5)
WBC: 13.1 10*3/uL — ABNORMAL HIGH (ref 4.0–10.5)
nRBC: 0 % (ref 0.0–0.2)

## 2019-09-19 LAB — URINE DRUG SCREEN, QUALITATIVE (ARMC ONLY)
Amphetamines, Ur Screen: NOT DETECTED
Barbiturates, Ur Screen: NOT DETECTED
Benzodiazepine, Ur Scrn: NOT DETECTED
Cannabinoid 50 Ng, Ur ~~LOC~~: POSITIVE — AB
Cocaine Metabolite,Ur ~~LOC~~: NOT DETECTED
MDMA (Ecstasy)Ur Screen: NOT DETECTED
Methadone Scn, Ur: NOT DETECTED
Opiate, Ur Screen: NOT DETECTED
Phencyclidine (PCP) Ur S: NOT DETECTED
Tricyclic, Ur Screen: NOT DETECTED

## 2019-09-19 LAB — ETHANOL: Alcohol, Ethyl (B): 12 mg/dL — ABNORMAL HIGH (ref <10)

## 2019-09-19 MED ORDER — ONDANSETRON 4 MG PO TBDP: 4.0000 mg | ORAL_TABLET | Freq: Once | ORAL | Status: DC

## 2019-09-19 MED ORDER — KETOROLAC TROMETHAMINE 30 MG/ML IJ SOLN
15.0000 mg | Freq: Once | INTRAMUSCULAR | Status: AC
  Administered 2019-09-19: 15 mg via INTRAVENOUS
  Filled 2019-09-19: qty 1

## 2019-09-19 MED ORDER — ONDANSETRON HCL 4 MG/2ML IJ SOLN
4.0000 mg | Freq: Once | INTRAMUSCULAR | Status: AC
  Administered 2019-09-19: 19:00:00 4 mg via INTRAVENOUS
  Filled 2019-09-19: qty 2

## 2019-09-19 MED ORDER — SODIUM CHLORIDE 0.9 % IV BOLUS
1000.0000 mL | Freq: Once | INTRAVENOUS | Status: AC
  Administered 2019-09-19: 1000 mL via INTRAVENOUS

## 2019-09-19 NOTE — ED Triage Notes (Signed)
Pt comes into the ED via EMS from home, states the pt drank 1/2 of 1/2 gallon of tequilla and several coronas last night and has been in bed all day and hasnt eaten or drank anything. Pt Is alert and oriented.

## 2019-09-19 NOTE — ED Provider Notes (Signed)
[facility] Emergency Department Provider Note  Time seen: 6:27 PM  I have reviewed the triage vital signs and the nursing notes.   HISTORY  Chief Complaint Alcohol Intoxication   HPI Henry Garcia is a 24 y.o. male with no significant past medical history presents to the emergency department for nausea vomiting fatigue.  According to the patient he drank a significant amount of alcohol last night, states he does not recall what happened throughout the night, states he awoke vomiting this morning.  Patient states he has been feeling chills all day and feeling foggy headed.  He believes he has alcohol poisoning so he came to the emergency department.  Patient states he has not vomited since this morning he has tried to but has been unable to.  Denies any chest pain or abdominal pain.  Denies any fever, states he feels cold.   Past Medical History:  Diagnosis Date  . Heart murmur   . Wolff-Parkinson-White syndrome     There are no problems to display for this patient.   Past Surgical History:  Procedure Laterality Date  . ELECTROPHYSIOLOGIC STUDY     08/2018  . HAND SURGERY Right    5th finger, 2012 ad 2013  . TONSILLECTOMY      Prior to Admission medications   Medication Sig Start Date End Date Taking? Authorizing Provider  acetaminophen (TYLENOL) 325 MG tablet Take 650 mg by mouth every 6 (six) hours as needed for mild pain.    [provider]  cyclobenzaprine (FLEXERIL) 10 MG tablet Take 1 tablet (10 mg total) by mouth 2 (two) times daily as needed for muscle spasms. 08/02/14   Katheren Shams, MD  gabapentin (NEURONTIN) 300 MG capsule Take 1 capsule (300 mg total) by mouth 3 (three) times daily as needed (Pain). 04/01/19 03/31/20  Nance Pear, MD  predniSONE (STERAPRED UNI-PAK 21 TAB) 10 MG (21) TBPK tablet Take 6 tablets the first day, take 5 tablets the second day, take 4 tablets the third day, take 3 tablets the fourth day, take 2 tablets  the fifth day, take 1 tablet the sixth day. 01/05/19   Lannie Fields, PA-C  predniSONE (STERAPRED UNI-PAK 21 TAB) 10 MG (21) TBPK tablet Per packaging instructions 04/01/19   Nance Pear, MD    Allergies  Allergen Reactions  . Amoxicillin Itching  . Clarithromycin Itching  . Cortisone Other (See Comments)    hiccups    No family history on file.  Social History Social History   Tobacco Use  . Smoking status: Never Smoker  . Smokeless tobacco: Never Used  Substance Use Topics  . Alcohol use: Yes    Comment: weekends  . Drug use: No    Review of Systems Constitutional: Positive for chills, feels cold.  Positive for fatigue. Cardiovascular: Negative for chest pain. Respiratory: Negative for shortness of breath. Gastrointestinal: Negative for abdominal pain.  Vomiting last night and this morning.  None since. Genitourinary: Negative for urinary compaints Musculoskeletal: Negative for musculoskeletal complaints Neurological: Negative for headache All other ROS negative  ____________________________________________   PHYSICAL EXAM:  VITAL SIGNS: ED Triage Vitals [09/19/19 1549]  Enc Vitals Group     BP (!) 141/67     Pulse Rate (!) 118     Resp 18     Temp 99.3 F (37.4 C)     Temp Source Oral     SpO2 97 %     Weight 120 lb (54.4 kg)  Height 5\' 7"  (1.702 m)     Head Circumference      Peak Flow      Pain Score 7     Pain Loc      Pain Edu?      Excl. in GC?    Constitutional: Alert and oriented. Well appearing and in no distress. Eyes: Normal exam ENT      Head: Normocephalic and atraumatic.      Mouth/Throat: Mucous membranes are moist. Cardiovascular: Normal rate, regular rhythm. Respiratory: Normal respiratory effort without tachypnea nor retractions. Breath sounds are clear  Gastrointestinal: Soft and nontender. No distention.   Musculoskeletal: Nontender with normal range of motion in all extremities. No lower extremity tenderness  Skin:   Skin is warm, dry and intact.  Psychiatric: Mood and affect are normal.    INITIAL IMPRESSION / ASSESSMENT AND PLAN / ED COURSE  Pertinent labs & imaging results that were available during my care of the patient were reviewed by me and considered in my medical decision making (see chart for details).   Patient presents emergency department for alcohol poisoning per patient.  States he drank a significant amount last night more than he is used to, does not recall some of the events of last night awoke last night vomiting per wife and again this morning per wife.  Patient states he has continued to felt very fatigued with chills today.  Denies any chest pain or abdominal pain.  Overall the patient appears well, no acute distress, overall normal physical exam.  Patient is slightly hypertensive otherwise reassuring vitals.  Lab work shows no significant findings.  We will IV hydrate and treat with IV Zofran.  Patient agreeable to plan of care.  Henry Garcia was evaluated in Emergency Department on 09/19/2019 for the symptoms described in the history of present illness. He was evaluated in the context of the global COVID-19 pandemic, which necessitated consideration that the patient might be at risk for infection with the SARS-CoV-2 virus that causes COVID-19. Institutional protocols and algorithms that pertain to the evaluation of patients at risk for COVID-19 are in a state of rapid change based on information released by regulatory bodies including the CDC and federal and state organizations. These policies and algorithms were followed during the patient's care in the ED.  ____________________________________________   FINAL CLINICAL IMPRESSION(S) / ED DIAGNOSES  Alcohol use disorder   11/19/2019, MD 09/19/19 [phone]

## 2020-07-11 ENCOUNTER — Encounter: Payer: Self-pay | Admitting: Emergency Medicine

## 2020-07-11 ENCOUNTER — Emergency Department: Payer: Self-pay

## 2020-07-11 ENCOUNTER — Other Ambulatory Visit: Payer: Self-pay

## 2020-07-11 ENCOUNTER — Emergency Department
Admission: EM | Admit: 2020-07-11 | Discharge: 2020-07-11 | Disposition: A | Discharge status: Home / Self Care | Payer: Self-pay | Attending: Student in an Organized Health Care Education/Training Program | Admitting: Student in an Organized Health Care Education/Training Program

## 2020-07-11 DIAGNOSIS — G20 Parkinson's disease: Secondary | ICD-10-CM | POA: Insufficient documentation

## 2020-07-11 DIAGNOSIS — W19XXXA Unspecified fall, initial encounter: Secondary | ICD-10-CM | POA: Insufficient documentation

## 2020-07-11 DIAGNOSIS — S2231XA Fracture of one rib, right side, initial encounter for closed fracture: Secondary | ICD-10-CM | POA: Insufficient documentation

## 2020-07-11 DIAGNOSIS — S20211A Contusion of right front wall of thorax, initial encounter: Secondary | ICD-10-CM | POA: Insufficient documentation

## 2020-07-11 MED ORDER — LIDOCAINE 5 % EX PTCH
1.0000 | MEDICATED_PATCH | Freq: Once | CUTANEOUS | Status: DC
  Administered 2020-07-11: 1 via TRANSDERMAL
  Filled 2020-07-11: qty 1

## 2020-07-11 MED ORDER — IBUPROFEN 800 MG PO TABS: 800.0000 mg | ORAL_TABLET | Freq: Three times a day (TID) | ORAL | 0 refills | Status: DC | PRN

## 2020-07-11 NOTE — ED Triage Notes (Signed)
Pt comes into the ED via POV c/o fall where he fell on top of a cinder block and landed on the right ribs.  Pt states he has pain to the rib but still has even and unlabored respirations.  Pt states this injury happened 2 days ago.

## 2020-07-11 NOTE — ED Notes (Signed)
See triage note. Pt had been carrying cinderblock Monday or Tuesday night and had fall because the lights were turned off. Pt dropped block and fell on it. Reports that he initially "spit out some blood". Denies hitting face/head. Denies LOC. Reports inc pain at R side of ribcage when raising R arm and when taking a deep breath. 4/10 pain when relaxed and 10/10 pain with major movement per pt. Pt denies any other injuries. A&Ox4. Resp reg/unlabored. No obviously visible change in skin color or major swelling noted at R ribcage currently.

## 2020-07-11 NOTE — ED Provider Notes (Addendum)
[facility] Emergency Department Provider Note ____________________________________________  Time seen: 1326  I have reviewed the triage vital signs and the nursing notes.  HISTORY  Chief Complaint  Rib Injury  HPI Story Henry Garcia is a 24 y.o. male presents himself to the ED for a mechanical fall resulting in right-sided chest wall pain.  Patient describes the accident occurred 2 days prior, when he fell, and landed on a cinder block.  He had immediate pain to the anterior lateral right chest, and episode of bloody sputum.  Since that time he has had ongoing chest wall pain with movement and activity.  Denies any shortness of breath, anxiety, or syncope.  He notes pain to the anterior chest wall that is aggravated by movement of his arms  and deep breaths.  He denies any other injury at this time.  Past Medical History:  Diagnosis Date  . Heart murmur   . Wolff-Parkinson-White syndrome     There are no problems to display for this patient.   Past Surgical History:  Procedure Laterality Date  . ELECTROPHYSIOLOGIC STUDY     08/2018  . HAND SURGERY Right    5th finger, 2012 ad 2013  . TONSILLECTOMY      Prior to Admission medications   Medication Sig Start Date End Date Taking? Authorizing Provider  gabapentin (NEURONTIN) 300 MG capsule Take 1 capsule (300 mg total) by mouth 3 (three) times daily as needed (Pain). 04/01/19 03/31/20  Phineas Semen, MD  ibuprofen (ADVIL) 800 MG tablet Take 1 tablet (800 mg total) by mouth every 8 (eight) hours as needed. 07/11/20   Shundra Wirsing, Charlesetta Ivory, PA-C    Allergies Amoxicillin, Clarithromycin, and Cortisone  History reviewed. No pertinent family history.  Social History Social History   Tobacco Use  . Smoking status: Never Smoker  . Smokeless tobacco: Never Used  Substance Use Topics  . Alcohol use: Yes    Comment: weekends  . Drug use: No    Review of Systems  Constitutional: Negative for  fever. Cardiovascular: Negative for chest pain. Respiratory: Negative for shortness of breath. Gastrointestinal: Negative for abdominal pain, vomiting and diarrhea. Genitourinary: Negative for dysuria. Musculoskeletal: Negative for back pain.  Reports chest wall pain as above. Skin: Negative for rash. Neurological: Negative for headaches, focal weakness or numbness. ____________________________________________  PHYSICAL EXAM:  VITAL SIGNS: ED Triage Vitals  Enc Vitals Group     BP 07/11/20 1257 120/87     Pulse Rate 07/11/20 1257 72     Resp 07/11/20 1257 19     Temp 07/11/20 1257 97.9 F (36.6 C)     Temp Source 07/11/20 1257 Oral     SpO2 07/11/20 1257 100 %     Weight 07/11/20 1146 145 lb (65.8 kg)     Height 07/11/20 1146 5\' 5"  (1.651 m)     Head Circumference --      Peak Flow --      Pain Score 07/11/20 1145 7     Pain Loc --      Pain Edu? --      Excl. in GC? --     Constitutional: Alert and oriented. Well appearing and in no distress. Head: Normocephalic and atraumatic. Eyes: Conjunctivae are normal. PERRL. Normal extraocular movements Ears: Canals clear. TMs intact bilaterally. Nose: No congestion/rhinorrhea/epistaxis. Mouth/Throat: Mucous membranes are moist. Neck: Supple. No thyromegaly. Hematological/Lymphatic/Immunological: No cervical lymphadenopathy. Cardiovascular: Normal rate, regular rhythm. Normal distal pulses. Respiratory: Normal respiratory effort. No wheezes/rales/rhonchi. Gastrointestinal: Soft  and nontender. No distention. Musculoskeletal: Nontender with normal range of motion in all extremities.  Neurologic:  Normal gait without ataxia. Normal speech and language. No gross focal neurologic deficits are appreciated. Skin:  Skin is warm, dry and intact. No rash noted. Psychiatric: Mood and affect are normal. Patient exhibits appropriate insight and judgment. ____________________________________________   RADIOLOGY  Right Rib  Detail  Negative ____________________________________________  PROCEDURES  lidoderm patch 5% topical  Procedures ____________________________________________  INITIAL IMPRESSION / ASSESSMENT AND PLAN / ED COURSE  ED evaluation mechanical fall 2 days prior, presents with right chest wall pain.  X-ray is not consistent with any acute dislocated fracture to the ribs.  Patient was treated however, empirically for the same rib fracture.  Lidoderm patches as well as Bactroban provided for his benefit.  Follow-up precautions were reviewed.  ----------------------------------------- 9:40 PM on 07/11/2020 ----------------------------------------- RX for Lidoderm called in to [facility] pharmacy after hours.    Jahlen Bollman was evaluated in Emergency Department on 07/11/2020 for the symptoms described in the history of present illness. He was evaluated in the context of the global COVID-19 pandemic, which necessitated consideration that the patient might be at risk for infection with the SARS-CoV-2 virus that causes COVID-19. Institutional protocols and algorithms that pertain to the evaluation of patients at risk for COVID-19 are in a state of rapid change based on information released by regulatory bodies including the CDC and federal and state organizations. These policies and algorithms were followed during the patient's care in the ED. ____________________________________________  FINAL CLINICAL IMPRESSION(S) / ED DIAGNOSES  Final diagnoses:  Closed fracture of one rib of right side, initial encounter  Chest wall contusion, right, initial encounter      Lissa Hoard, PA-C 07/11/20 2139    Karmen Stabs, Charlesetta Ivory, PA-C 07/11/20 2140    Willy Eddy, MD 07/12/20 [phone]

## 2020-07-11 NOTE — Discharge Instructions (Addendum)
You are being treated for suspected rib fracture on the right.  You pain is your guide as to what activity she participated in.  Use the Lidoderm as directed for pain relief.  Take over-the-counter Tylenol and prescription ibuprofen for pain relief as discussed.  Follow-up with [facility] or other community clinic ongoing symptoms.  Return to the ED if needed.

## 2021-02-03 ENCOUNTER — Emergency Department
Admission: EM | Admit: 2021-02-03 | Discharge: 2021-02-03 | Disposition: A | Discharge status: Home / Self Care | Payer: Self-pay | Attending: Emergency Medicine | Admitting: Emergency Medicine

## 2021-02-03 ENCOUNTER — Emergency Department: Payer: Self-pay

## 2021-02-03 ENCOUNTER — Other Ambulatory Visit: Payer: Self-pay

## 2021-02-03 DIAGNOSIS — R110 Nausea: Secondary | ICD-10-CM | POA: Insufficient documentation

## 2021-02-03 DIAGNOSIS — N5089 Other specified disorders of the male genital organs: Secondary | ICD-10-CM

## 2021-02-03 DIAGNOSIS — N50811 Right testicular pain: Secondary | ICD-10-CM | POA: Insufficient documentation

## 2021-02-03 LAB — URINALYSIS, COMPLETE (UACMP) WITH MICROSCOPIC
Bacteria, UA: NONE SEEN
Bilirubin Urine: NEGATIVE
Glucose, UA: NEGATIVE mg/dL
Ketones, ur: NEGATIVE mg/dL
Leukocytes,Ua: NEGATIVE
Nitrite: NEGATIVE
Protein, ur: NEGATIVE mg/dL
Specific Gravity, Urine: 1.008 (ref 1.005–1.030)
Squamous Epithelial / HPF: NONE SEEN (ref 0–5)
WBC, UA: NONE SEEN WBC/hpf (ref 0–5)
pH: 8 (ref 5.0–8.0)

## 2021-02-03 NOTE — ED Provider Notes (Signed)
[facility] Emergency Department Provider Note   ____________________________________________   Event Date/Time   First MD Initiated Contact with Patient 02/03/21 1603     (approximate)  I have reviewed the triage vital signs and the nursing notes.   HISTORY  Chief Complaint Groin Swelling    HPI Henry Garcia is a 25 y.o. male who presents for right testicular pain  LOCATION: Right testicle DURATION: 2 days TIMING: Stable since onset SEVERITY: 5/10 QUALITY: Aching CONTEXT: Patient states that he was at work when he began feeling a slow onset aching to the right testicle and a sensation of swelling MODIFYING FACTORS: Palpation worsens this pain as does ambulation.  Patient states that lifting this testicle partially relieves his pain ASSOCIATED SYMPTOMS: Mild nausea   Per medical record review, history noncontributory          Past Medical History:  Diagnosis Date   Heart murmur    Wolff-Parkinson-White syndrome     There are no problems to display for this patient.   Past Surgical History:  Procedure Laterality Date   ELECTROPHYSIOLOGIC STUDY     08/2018   HAND SURGERY Right    5th finger, 2012 ad 2013   TONSILLECTOMY      Prior to Admission medications   Medication Sig Start Date End Date Taking? Authorizing Provider  gabapentin (NEURONTIN) 300 MG capsule Take 1 capsule (300 mg total) by mouth 3 (three) times daily as needed (Pain). 04/01/19 03/31/20  Phineas Semen, MD  ibuprofen (ADVIL) 800 MG tablet Take 1 tablet (800 mg total) by mouth every 8 (eight) hours as needed. 07/11/20   Menshew, Charlesetta Ivory, PA-C    Allergies Amoxicillin, Clarithromycin, and Cortisone  No family history on file.  Social History Social History   Tobacco Use   Smoking status: Never   Smokeless tobacco: Never  Substance Use Topics   Alcohol use: Yes    Comment: weekends   Drug use: No    Review of Systems Constitutional: No  fever/chills Eyes: No visual changes. ENT: No sore throat. Cardiovascular: Denies chest pain. Respiratory: Denies shortness of breath. Gastrointestinal: No abdominal pain.  No nausea, no vomiting.  No diarrhea. Genitourinary: Negative for dysuria.  Positive for right testicular pain Musculoskeletal: Negative for acute arthralgias Skin: Negative for rash. Neurological: Negative for headaches, weakness/numbness/paresthesias in any extremity Psychiatric: Negative for suicidal ideation/homicidal ideation   ____________________________________________   PHYSICAL EXAM:  VITAL SIGNS: ED Triage Vitals  Enc Vitals Group     BP 02/03/21 1515 118/89     Pulse Rate 02/03/21 1515 74     Resp 02/03/21 1515 18     Temp 02/03/21 1515 98.5 F (36.9 C)     Temp src --      SpO2 02/03/21 1515 100 %     Weight 02/03/21 1800 145 lb 1 oz (65.8 kg)     Height 02/03/21 1800 5\' 5"  (1.651 m)     Head Circumference --      Peak Flow --      Pain Score 02/03/21 1513 7     Pain Loc --      Pain Edu? --      Excl. in GC? --    Constitutional: Alert and oriented. Well appearing and in no acute distress. Eyes: Conjunctivae are normal. PERRL. Head: Atraumatic. Nose: No congestion/rhinnorhea. Mouth/Throat: Mucous membranes are moist. Neck: No stridor Cardiovascular: Grossly normal heart sounds.  Good peripheral circulation. Respiratory: Normal respiratory effort.  No  retractions. Gastrointestinal: Soft and nontender. No distention. Genitourinary: Normal uncircumcised male genitalia without lesions or rashes.  Right testicle in normal lie and with minimal tenderness to palpation.  No hernia appreciated and no hydrocele Musculoskeletal: No obvious deformities Neurologic:  Normal speech and language. No gross focal neurologic deficits are appreciated. Skin:  Skin is warm and dry. No rash noted. Psychiatric: Mood and affect are normal. Speech and behavior are  normal.  ____________________________________________   LABS (all labs ordered are listed, but only abnormal results are displayed)  Labs Reviewed  URINALYSIS, COMPLETE (UACMP) WITH MICROSCOPIC - Abnormal; Notable for the following components:      Result Value   Color, Urine STRAW (*)    APPearance CLEAR (*)    Hgb urine dipstick SMALL (*)    All other components within normal limits   RADIOLOGY  ED MD interpretation: Ultrasound of the scrotum with Doppler is unremarkable  Official radiology report(s): US SCROTUM W/DOPPLER  Result Date: 02/03/2021 CLINICAL DATA:  Scrotal swelling for 24 hours. EXAM: SCROTAL ULTRASOUND DOPPLER ULTRASOUND OF THE TESTICLES TECHNIQUE: Complete ultrasound examination of the testicles, epididymis, and other scrotal structures was performed. Color and spectral Doppler ultrasound were also utilized to evaluate blood flow to the testicles. COMPARISON:  None. FINDINGS: Right testicle Measurements: 4.3 x 2.1 x 3.0 cm. Homogeneous echogenicity. Normal blood flow. No mass or microlithiasis visualized. Left testicle Measurements: 4.2 x 2.2 x 2.7 cm. Homogeneous echogenicity. Normal blood flow. No mass or microlithiasis visualized. Right epididymis:  Normal in size and appearance. Left epididymis:  Normal in size and appearance. Hydrocele:  None visualized. Varicocele:  None visualized. Pulsed Doppler interrogation of both testes demonstrates normal low resistance arterial and venous waveforms bilaterally. Visualized scrotal skin thickening. IMPRESSION: Unremarkable scrotal ultrasound. No explanation for scrotal swelling. Electronically Signed   By: Narda Rutherford M.D.   On: 02/03/2021 18:00    ____________________________________________   PROCEDURES  Procedure(s) performed (including Critical Care):  Procedures   ____________________________________________   INITIAL IMPRESSION / ASSESSMENT AND PLAN / ED COURSE  As part of my medical decision making, I  reviewed the following data within the electronic medical record, if available:  Nursing notes reviewed and incorporated, Labs reviewed, EKG interpreted, Old chart reviewed, Radiograph reviewed and Notes from prior ED visits reviewed and incorporated        The patient is suffering from testicular pain, but based on the history, exam, and testing, I do not suspect that the patient has testicular torsion, abscess, severe cellulitis, Fourniers gangrene, or other emergent cause.  UA unremarkable US Scrotum: Shows no evidence of acute abnormalities  Disposition: Plan follow up with primary care doctor for symptom re-check and possible referral to urology. Discussed return precautions at bedside. Discharge.      ____________________________________________   FINAL CLINICAL IMPRESSION(S) / ED DIAGNOSES  Final diagnoses:  Right testicular pain     ED Discharge Orders     None        Note:  This document was prepared using Dragon voice recognition software and may include unintentional dictation errors.    Merwyn Katos, MD 02/03/21 [phone]

## 2021-02-03 NOTE — ED Triage Notes (Signed)
Pt comes with c/o groin swelling. Pt states right side of scrotum. Pt states he noticed this yesterday. Pt states pain and swelling, tender to touch.

## 2021-03-28 ENCOUNTER — Encounter: Payer: Self-pay | Admitting: *Deleted

## 2021-03-28 ENCOUNTER — Other Ambulatory Visit: Payer: Self-pay

## 2021-03-28 ENCOUNTER — Emergency Department
Admission: EM | Admit: 2021-03-28 | Discharge: 2021-03-29 | Disposition: A | Discharge status: Home / Self Care | Payer: Self-pay | Attending: Emergency Medicine | Admitting: Emergency Medicine

## 2021-03-28 DIAGNOSIS — Y906 Blood alcohol level of 120-199 mg/100 ml: Secondary | ICD-10-CM | POA: Insufficient documentation

## 2021-03-28 DIAGNOSIS — Z20822 Contact with and (suspected) exposure to covid-19: Secondary | ICD-10-CM | POA: Insufficient documentation

## 2021-03-28 DIAGNOSIS — R45851 Suicidal ideations: Secondary | ICD-10-CM | POA: Insufficient documentation

## 2021-03-28 DIAGNOSIS — F1010 Alcohol abuse, uncomplicated: Secondary | ICD-10-CM | POA: Insufficient documentation

## 2021-03-28 DIAGNOSIS — F32A Depression, unspecified: Secondary | ICD-10-CM | POA: Insufficient documentation

## 2021-03-28 LAB — CBC
HCT: 39.2 % (ref 39.0–52.0)
Hemoglobin: 13.5 g/dL (ref 13.0–17.0)
MCH: 30.3 pg (ref 26.0–34.0)
MCHC: 34.4 g/dL (ref 30.0–36.0)
MCV: 88.1 fL (ref 80.0–100.0)
Platelets: 305 10*3/uL (ref 150–400)
RBC: 4.45 MIL/uL (ref 4.22–5.81)
RDW: 12.7 % (ref 11.5–15.5)
WBC: 7.1 10*3/uL (ref 4.0–10.5)
nRBC: 0 % (ref 0.0–0.2)

## 2021-03-28 NOTE — ED Triage Notes (Signed)
Pt brought here voluntary by [employer]. Says he has heavy burdens going on. Says he would not hurt himself because he has kids, but on the other hand would hurt himself because he feels pretty useless to others. No specific plan. Feelings have been ongoing for some time. Does not take medications on a daily basis. ETOH tonight.

## 2021-03-29 LAB — RESP PANEL BY RT-PCR (FLU A&B, COVID) ARPGX2
Influenza A by PCR: NEGATIVE
Influenza B by PCR: NEGATIVE
SARS Coronavirus 2 by RT PCR: NEGATIVE

## 2021-03-29 LAB — URINE DRUG SCREEN, QUALITATIVE (ARMC ONLY)
Amphetamines, Ur Screen: NOT DETECTED
Barbiturates, Ur Screen: NOT DETECTED
Benzodiazepine, Ur Scrn: NOT DETECTED
Cannabinoid 50 Ng, Ur ~~LOC~~: NOT DETECTED
Cocaine Metabolite,Ur ~~LOC~~: NOT DETECTED
MDMA (Ecstasy)Ur Screen: NOT DETECTED
Methadone Scn, Ur: NOT DETECTED
Opiate, Ur Screen: NOT DETECTED
Phencyclidine (PCP) Ur S: NOT DETECTED
Tricyclic, Ur Screen: NOT DETECTED

## 2021-03-29 LAB — COMPREHENSIVE METABOLIC PANEL
ALT: 12 U/L (ref 0–44)
AST: 21 U/L (ref 15–41)
Albumin: 5.1 g/dL — ABNORMAL HIGH (ref 3.5–5.0)
Alkaline Phosphatase: 75 U/L (ref 38–126)
Anion gap: 10 (ref 5–15)
BUN: 11 mg/dL (ref 6–20)
CO2: 28 mmol/L (ref 22–32)
Calcium: 9.6 mg/dL (ref 8.9–10.3)
Chloride: 103 mmol/L (ref 98–111)
Creatinine, Ser: 0.51 mg/dL — ABNORMAL LOW (ref 0.61–1.24)
GFR, Estimated: >60 mL/min (ref >60)
Glucose, Bld: 102 mg/dL — ABNORMAL HIGH (ref 70–99)
Potassium: 3.9 mmol/L (ref 3.5–5.1)
Sodium: 141 mmol/L (ref 135–145)
Total Bilirubin: 0.7 mg/dL (ref 0.3–1.2)
Total Protein: 8.1 g/dL (ref 6.5–8.1)

## 2021-03-29 LAB — SALICYLATE LEVEL: Salicylate Lvl: <7 mg/dL — ABNORMAL LOW (ref 7.0–30.0)

## 2021-03-29 LAB — ETHANOL: Alcohol, Ethyl (B): 155 mg/dL — ABNORMAL HIGH (ref <10)

## 2021-03-29 LAB — ACETAMINOPHEN LEVEL: Acetaminophen (Tylenol), Serum: <10 ug/mL — ABNORMAL LOW (ref 10–30)

## 2021-03-29 MED ORDER — NEPHRO-VITE RX 1 MG PO TABS: 1.0000 | ORAL_TABLET | Freq: Every day | ORAL | 3 refills | Status: DC

## 2021-03-29 MED ORDER — THIAMINE HCL 100 MG PO TABS: 100.0000 mg | ORAL_TABLET | Freq: Every day | ORAL | 3 refills | Status: DC

## 2021-03-29 NOTE — ED Provider Notes (Signed)
[facility] Emergency Department Provider Note  ____________________________________________   Event Date/Time   First MD Initiated Contact with Patient 03/28/21 2357     (approximate)  I have reviewed the triage vital signs and the nursing notes.   HISTORY  Chief Complaint No chief complaint on file.    HPI Henry Garcia is a 25 y.o. male with history of Wolff-Parkinson-White, alcohol abuse who presents to the emergency department stating that he is suicidal.  When asked to describe this further he states "I am and I am not".  States that he has 2 daughters that he wants to live for but he also reports he is going through a separation and that family members are telling him that he is "useless".  He denies previous psychiatric illness, psychiatric hospitalization or previous suicide attempt.  No plan.  No HI or hallucinations.  States he does drink 4-5 beers or 4 glasses of whiskey every day.  He states there are days that he goes without drinking and never has withdrawal symptoms, hallucinations or seizures.  States he has been drinking alcohol tonight.  Denies any drug use.        Past Medical History:  Diagnosis Date   Heart murmur    Wolff-Parkinson-White syndrome     There are no problems to display for this patient.   Past Surgical History:  Procedure Laterality Date   ELECTROPHYSIOLOGIC STUDY     08/2018   HAND SURGERY Right    5th finger, 2012 ad 2013   TONSILLECTOMY      Prior to Admission medications   Medication Sig Start Date End Date Taking? Authorizing Provider  B Complex-C-Folic Acid (B COMPLEX-VITAMIN C-FOLIC ACID) 1 MG tablet Take 1 tablet by mouth daily with breakfast. 03/29/21   Ariz Terrones, Layla Maw, DO  thiamine 100 MG tablet Take 1 tablet (100 mg total) by mouth daily. 03/29/21   Novak Stgermaine, Layla Maw, DO    Allergies Amoxicillin, Clarithromycin, and Cortisone  No family history on file.  Social History Social History    Tobacco Use   Smoking status: Never   Smokeless tobacco: Never  Substance Use Topics   Alcohol use: Yes    Comment: weekends   Drug use: No    Review of Systems Constitutional: No fever. Eyes: No visual changes. ENT: No sore throat. Cardiovascular: Denies chest pain. Respiratory: Denies shortness of breath. Gastrointestinal: No nausea, vomiting, diarrhea. Genitourinary: Negative for dysuria. Musculoskeletal: Negative for back pain. Skin: Negative for rash. Neurological: Negative for focal weakness or numbness.  ____________________________________________   PHYSICAL EXAM:  VITAL SIGNS: ED Triage Vitals  Enc Vitals Group     BP 03/28/21 2327 (!) 132/95     Pulse Rate 03/28/21 2327 80     Resp 03/28/21 2327 18     Temp 03/28/21 2327 99.4 F (37.4 C)     Temp Source 03/28/21 2327 Oral     SpO2 03/28/21 2327 100 %     Weight 03/28/21 2327 130 lb (59 kg)     Height 03/28/21 2327 5\' 5"  (1.651 m)     Head Circumference --      Peak Flow --      Pain Score 03/28/21 2327 0     Pain Loc --      Pain Edu? --      Excl. in GC? --    CONSTITUTIONAL: Alert and oriented and responds appropriately to questions. Well-appearing; well-nourished HEAD: Normocephalic EYES: Conjunctivae clear, pupils appear equal,  EOM appear intact ENT: normal nose; moist mucous membranes NECK: Supple, normal ROM CARD: RRR; S1 and S2 appreciated; no murmurs, no clicks, no rubs, no gallops RESP: Normal chest excursion without splinting or tachypnea; breath sounds clear and equal bilaterally; no wheezes, no rhonchi, no rales, no hypoxia or respiratory distress, speaking full sentences ABD/GI: Normal bowel sounds; non-distended; soft, non-tender, no rebound, no guarding, no peritoneal signs, no hepatosplenomegaly BACK: The back appears normal EXT: Normal ROM in all joints; no deformity noted, no edema; no cyanosis SKIN: Normal color for age and race; warm; no rash on exposed skin NEURO: Moves all  extremities equally, normal speech, normal gait PSYCH: The patient's mood and manner are appropriate.  Endorses intermittent SI.  No plan.  No HI or hallucinations.  Pleasant.  ____________________________________________   LABS (all labs ordered are listed, but only abnormal results are displayed)  Labs Reviewed  COMPREHENSIVE METABOLIC PANEL - Abnormal; Notable for the following components:      Result Value   Glucose, Bld 102 (*)    Creatinine, Ser 0.51 (*)    Albumin 5.1 (*)    All other components within normal limits  ETHANOL - Abnormal; Notable for the following components:   Alcohol, Ethyl (B) 155 (*)    All other components within normal limits  SALICYLATE LEVEL - Abnormal; Notable for the following components:   Salicylate Lvl <7.0 (*)    All other components within normal limits  ACETAMINOPHEN LEVEL - Abnormal; Notable for the following components:   Acetaminophen (Tylenol), Serum <10 (*)    All other components within normal limits  RESP PANEL BY RT-PCR (FLU A&B, COVID) ARPGX2  CBC  URINE DRUG SCREEN, QUALITATIVE ([facility] ONLY)   ____________________________________________  EKG   ____________________________________________  RADIOLOGY I, Maryetta Shafer, personally viewed and evaluated these images (plain radiographs) as part of my medical decision making, as well as reviewing the written report by the radiologist.  ED MD interpretation:    Official radiology report(s): No results found.  ____________________________________________   PROCEDURES  Procedure(s) performed (including Critical Care):  Procedures   ____________________________________________   INITIAL IMPRESSION / ASSESSMENT AND PLAN / ED COURSE  As part of my medical decision making, I reviewed the following data within the electronic MEDICAL RECORD NUMBER Nursing notes reviewed and incorporated, Labs reviewed , Old chart reviewed, A consult was requested and obtained from this/these  consultant(s) Psychiatry, and Notes from prior ED visits         Patient here with intermittent SI, alcohol abuse.  He is here voluntarily.  Will obtain screening labs, urine and consult TTS, psychiatry for further disposition.  ED PROGRESS  Labs unremarkable other than alcohol level of 150.  Patient seen by Lerry Liner, NP with psychiatry as well as TTS.  Patient now states that he is no longer suicidal and feels much better after a nap.  He contracts for safety.  He feels comfortable with follow-up with outpatient providers.  Will discharge home with multivitamins, thiamine given his alcohol use.  Does not meet criteria for inpatient psychiatric treatment.  At this time, I do not feel there is any life-threatening condition present. I have reviewed, interpreted and discussed all results (EKG, imaging, lab, urine as appropriate) and exam findings with patient/family. I have reviewed nursing notes and appropriate previous records.  I feel the patient is safe to be discharged home without further emergent workup and can continue workup as an outpatient as needed. Discussed usual and customary return precautions. Patient/family  verbalize understanding and are comfortable with this plan.  Outpatient follow-up has been provided as needed. All questions have been answered.  ____________________________________________   FINAL CLINICAL IMPRESSION(S) / ED DIAGNOSES  Final diagnoses:  Alcohol abuse  Depression, unspecified depression type     ED Discharge Orders          Ordered    B Complex-C-Folic Acid (B COMPLEX-VITAMIN C-FOLIC ACID) 1 MG tablet  Daily with breakfast,   Status:  Discontinued        03/29/21 0113    thiamine 100 MG tablet  Daily,   Status:  Discontinued        03/29/21 0113    B Complex-C-Folic Acid (B COMPLEX-VITAMIN C-FOLIC ACID) 1 MG tablet  Daily with breakfast        03/29/21 0114    thiamine 100 MG tablet  Daily        03/29/21 0114            *Please  note:  Henry Garcia was evaluated in Emergency Department on 03/29/2021 for the symptoms described in the history of present illness. He was evaluated in the context of the global COVID-19 pandemic, which necessitated consideration that the patient might be at risk for infection with the SARS-CoV-2 virus that causes COVID-19. Institutional protocols and algorithms that pertain to the evaluation of patients at risk for COVID-19 are in a state of rapid change based on information released by regulatory bodies including the CDC and federal and state organizations. These policies and algorithms were followed during the patient's care in the ED.  Some ED evaluations and interventions may be delayed as a result of limited staffing during and the pandemic.*   Note:  This document was prepared using Dragon voice recognition software and may include unintentional dictation errors.    Nikkole Placzek, Layla Maw, DO 03/29/21 [phone]

## 2021-04-08 ENCOUNTER — Encounter: Payer: Self-pay | Admitting: Emergency Medicine

## 2021-04-08 ENCOUNTER — Other Ambulatory Visit: Payer: Self-pay

## 2021-04-08 ENCOUNTER — Emergency Department: Payer: Self-pay

## 2021-04-08 DIAGNOSIS — R0602 Shortness of breath: Secondary | ICD-10-CM | POA: Insufficient documentation

## 2021-04-08 DIAGNOSIS — Z5321 Procedure and treatment not carried out due to patient leaving prior to being seen by health care provider: Secondary | ICD-10-CM | POA: Insufficient documentation

## 2021-04-08 DIAGNOSIS — R079 Chest pain, unspecified: Secondary | ICD-10-CM | POA: Insufficient documentation

## 2021-04-08 LAB — BASIC METABOLIC PANEL
Anion gap: 12 (ref 5–15)
BUN: 15 mg/dL (ref 6–20)
CO2: 20 mmol/L — ABNORMAL LOW (ref 22–32)
Calcium: 9.3 mg/dL (ref 8.9–10.3)
Chloride: 108 mmol/L (ref 98–111)
Creatinine, Ser: 0.69 mg/dL (ref 0.61–1.24)
GFR, Estimated: >60 mL/min (ref >60)
Glucose, Bld: 101 mg/dL — ABNORMAL HIGH (ref 70–99)
Potassium: 3.5 mmol/L (ref 3.5–5.1)
Sodium: 140 mmol/L (ref 135–145)

## 2021-04-08 LAB — CBC
HCT: 39.7 % (ref 39.0–52.0)
Hemoglobin: 14 g/dL (ref 13.0–17.0)
MCH: 30.6 pg (ref 26.0–34.0)
MCHC: 35.3 g/dL (ref 30.0–36.0)
MCV: 86.9 fL (ref 80.0–100.0)
Platelets: 348 10*3/uL (ref 150–400)
RBC: 4.57 MIL/uL (ref 4.22–5.81)
RDW: 12.3 % (ref 11.5–15.5)
WBC: 10 10*3/uL (ref 4.0–10.5)
nRBC: 0 % (ref 0.0–0.2)

## 2021-04-08 LAB — TROPONIN I (HIGH SENSITIVITY): Troponin I (High Sensitivity): 13 ng/L (ref <18)

## 2021-04-08 NOTE — ED Triage Notes (Signed)
Pt to ED via EMS from home c/o chest pain to left chest like needles, SOB, denies n/v/d.  Hx of WPW.  States going through stress at home as well.  Pt A&Ox4, chest rise even and unlabored, skin WNL and in NAD at this time.  EMS CBG 106.  States had 3 small glasses of whiskey tonight.

## 2021-04-09 ENCOUNTER — Emergency Department
Admission: EM | Admit: 2021-04-09 | Discharge: 2021-04-09 | Disposition: A | Discharge status: Home / Self Care | Payer: Self-pay | Attending: Emergency Medicine | Admitting: Emergency Medicine

## 2021-04-09 NOTE — ED Notes (Signed)
No answer when called several times from [facility] 

## 2021-07-07 ENCOUNTER — Emergency Department: Payer: Self-pay

## 2021-07-07 ENCOUNTER — Emergency Department
Admission: EM | Admit: 2021-07-07 | Discharge: 2021-07-07 | Disposition: A | Discharge status: Home / Self Care | Payer: Self-pay | Attending: Emergency Medicine | Admitting: Emergency Medicine

## 2021-07-07 ENCOUNTER — Other Ambulatory Visit: Payer: Self-pay

## 2021-07-07 ENCOUNTER — Encounter: Payer: Self-pay | Admitting: Intensive Care

## 2021-07-07 DIAGNOSIS — Z87891 Personal history of nicotine dependence: Secondary | ICD-10-CM | POA: Insufficient documentation

## 2021-07-07 DIAGNOSIS — W208XXA Other cause of strike by thrown, projected or falling object, initial encounter: Secondary | ICD-10-CM | POA: Insufficient documentation

## 2021-07-07 DIAGNOSIS — S62346A Nondisplaced fracture of base of fifth metacarpal bone, right hand, initial encounter for closed fracture: Secondary | ICD-10-CM | POA: Insufficient documentation

## 2021-07-07 NOTE — Discharge Instructions (Signed)
You have a fracture of your right hand.  Please keep the splint on until you have followed up with orthopedics.  You can take ibuprofen for pain.

## 2021-07-07 NOTE — ED Provider Notes (Addendum)
[facility]  ____________________________________________   Event Date/Time   First MD Initiated Contact with Patient 07/07/21 1731     (approximate)  I have reviewed the triage vital signs and the nursing notes.   HISTORY  Chief Complaint Hand Injury    HPI Henry Garcia is a 25 y.o. male past medical history of WPW who presents with hand pain.  Several days ago patient was doing work and a cinderblock was accidentally on the right hand.  He has since had pain on the lateral part of the right hand.  He is right-handed.  Not taking anything for it.  Also has noted some swelling.  Denies any open wounds.         Past Medical History:  Diagnosis Date   Heart murmur    Wolff-Parkinson-White syndrome     There are no problems to display for this patient.   Past Surgical History:  Procedure Laterality Date   ELECTROPHYSIOLOGIC STUDY     08/2018   HAND SURGERY Right    5th finger, 2012 ad 2013   TONSILLECTOMY      Prior to Admission medications   Medication Sig Start Date End Date Taking? Authorizing Provider  B Complex-C-Folic Acid (B COMPLEX-VITAMIN C-FOLIC ACID) 1 MG tablet Take 1 tablet by mouth daily with breakfast. 03/29/21   Ward, Layla Maw, DO  thiamine 100 MG tablet Take 1 tablet (100 mg total) by mouth daily. 03/29/21   Ward, Layla Maw, DO    Allergies Amoxicillin, Clarithromycin, and Cortisone  History reviewed. No pertinent family history.  Social History Social History   Tobacco Use   Smoking status: Former    Types: Cigarettes   Smokeless tobacco: Never  Substance Use Topics   Alcohol use: Yes    Alcohol/week: 21.0 standard drinks    Types: 21 Shots of liquor per week   Drug use: No    Review of Systems   Review of Systems  Musculoskeletal:  Positive for arthralgias and myalgias.  Neurological:  Negative for weakness and numbness.  All other systems reviewed and are negative.  Physical Exam Updated Vital  Signs BP 126/80 (BP Location: Left Arm)    Pulse 79    Temp 98.4 F (36.9 C) (Oral)    Resp 16    Ht 5\' 5"  (1.651 m)    Wt 65.8 kg    SpO2 99%    BMI 24.13 kg/m   Physical Exam Vitals and nursing note reviewed.  Constitutional:      General: He is not in acute distress.    Appearance: Normal appearance.  HENT:     Head: Normocephalic and atraumatic.  Eyes:     General: No scleral icterus.    Conjunctiva/sclera: Conjunctivae normal.  Pulmonary:     Effort: Pulmonary effort is normal. No respiratory distress.     Breath sounds: Normal breath sounds. No wheezing.  Musculoskeletal:        General: No deformity or signs of injury.     Cervical back: Normal range of motion.     Comments: Mild tenderness to palpation over the base of the fifth metacarpal with mild swelling no deformity 2+ radial pulse Intact thumbs up, okay sign and finger abduction No snuffbox tenderness Normal range of motion at the wrist  Skin:    Coloration: Skin is not jaundiced or pale.  Neurological:     General: No focal deficit present.     Mental Status: He is alert and oriented  to person, place, and time. Mental status is at baseline.  Psychiatric:        Mood and Affect: Mood normal.        Behavior: Behavior normal.     LABS (all labs ordered are listed, but only abnormal results are displayed)  Labs Reviewed - No data to display ____________________________________________  EKG   ____________________________________________  RADIOLOGY Ky Barban, personally viewed and evaluated these images (plain radiographs) as part of my medical decision making, as well as reviewing the written report by the radiologist.  ED MD interpretation: Viewed the x-ray which shows a nondisplaced fracture at the base of the fifth metacarpal    ____________________________________________   PROCEDURES  Procedure(s) performed (including Critical Care):  .Splint Application  Date/Time: 07/07/2021  6:57 PM Performed by: Georga Hacking, MD Authorized by: Georga Hacking, MD   Consent:    Consent obtained:  Verbal Universal protocol:    Patient identity confirmed:  Verbally with patient Pre-procedure details:    Distal neurologic exam:  Normal   Distal perfusion: distal pulses strong and brisk capillary refill   Procedure details:    Location:  Hand   Hand location:  R hand   Strapping: no     Cast type:  Short arm   Splint type:  Ulnar gutter   Supplies:  Fiberglass Post-procedure details:    Distal neurologic exam:  Normal   Distal perfusion: distal pulses strong     Procedure completion:  Tolerated   Post-procedure imaging: not applicable     ____________________________________________   INITIAL IMPRESSION / ASSESSMENT AND PLAN / ED COURSE     Patient is a 25 year old male presents with right hand pain after cinderblock was dropped on the right hand several days ago.  He is neurovascular intact.  Tenderness localized to the base of the fifth metacarpal without any open wound.  X-ray demonstrates a nondisplaced fracture of the base of the fifth metacarpal.  He was placed in ulnar gutter splint.  Given Ortho follow-up.  He is stable for discharge.      ____________________________________________   FINAL CLINICAL IMPRESSION(S) / ED DIAGNOSES  Final diagnoses:  Closed nondisplaced fracture of base of fifth metacarpal bone of right hand, initial encounter     ED Discharge Orders     None        Note:  This document was prepared using Dragon voice recognition software and may include unintentional dictation errors.    Georga Hacking, MD 07/07/21 Herbie Baltimore    Georga Hacking, MD 07/07/21 [phone]

## 2021-07-07 NOTE — ED Provider Notes (Signed)
°  Emergency Medicine Provider Triage Evaluation Note  Henry Garcia , a 25 y.o.male,  was evaluated in triage.  Pt complains of right hand pain.  Patient states that his wife dropped a cinder block on his hand a few days ago.  Reports pain and swelling.  Denies wrist/forearm/elbow/arm pain.  Denies fever/chills   Review of Systems  Positive: Right hand pain Negative: Denies fever, chest pain, vomiting  Physical Exam  There were no vitals filed for this visit. Gen:   Awake, no distress   Resp:  Normal effort  MSK:   Moves extremities without difficulty  Other:  Tenderness noted along the fifth metacarpal.  Medical Decision Making  Given the patient's initial medical screening exam, the following diagnostic evaluation has been ordered. The patient will be placed in the appropriate treatment space, once one is available, to complete the evaluation and treatment. I have discussed the plan of care with the patient and I have advised the patient that an ED physician or mid-level practitioner will reevaluate their condition after the test results have been received, as the results may give them additional insight into the type of treatment they may need.    Diagnostics: Right hand x-ray  Treatments: none immediately   Varney Daily, PA 07/07/21 1731    Gilles Chiquito, MD 07/07/21 [phone]

## 2021-07-07 NOTE — ED Triage Notes (Signed)
Patient had cinder block dropped on his hand a few days ago. Hand is swollen and patient reports he cannot move

## 2021-09-30 ENCOUNTER — Emergency Department
Admission: EM | Admit: 2021-09-30 | Discharge: 2021-09-30 | Disposition: A | Discharge status: Home / Self Care | Payer: Self-pay | Attending: Emergency Medicine | Admitting: Emergency Medicine

## 2021-09-30 ENCOUNTER — Emergency Department: Payer: Self-pay

## 2021-09-30 ENCOUNTER — Other Ambulatory Visit: Payer: Self-pay

## 2021-09-30 ENCOUNTER — Encounter: Payer: Self-pay | Admitting: Emergency Medicine

## 2021-09-30 DIAGNOSIS — M545 Low back pain, unspecified: Secondary | ICD-10-CM | POA: Insufficient documentation

## 2021-09-30 DIAGNOSIS — M546 Pain in thoracic spine: Secondary | ICD-10-CM | POA: Insufficient documentation

## 2021-09-30 DIAGNOSIS — G20 Parkinson's disease: Secondary | ICD-10-CM | POA: Insufficient documentation

## 2021-09-30 DIAGNOSIS — Y92410 Unspecified street and highway as the place of occurrence of the external cause: Secondary | ICD-10-CM | POA: Insufficient documentation

## 2021-09-30 MED ORDER — CYCLOBENZAPRINE HCL 7.5 MG PO TABS: 7.5000 mg | ORAL_TABLET | Freq: Two times a day (BID) | ORAL | 0 refills | Status: DC | PRN

## 2021-09-30 MED ORDER — IBUPROFEN 800 MG PO TABS
800.0000 mg | ORAL_TABLET | Freq: Once | ORAL | Status: AC
  Administered 2021-09-30: 800 mg via ORAL
  Filled 2021-09-30: qty 1

## 2021-09-30 NOTE — Discharge Instructions (Addendum)
You may alternate Tylenol 1000 mg every 6 hours as needed for pain, fever and Ibuprofen 800 mg every 6-8 hours as needed for pain, fever.  Please take Ibuprofen with food.  Do not take more than 4000 mg of Tylenol (acetaminophen) in a 24 hour period. ? ?Do not take cyclobenzaprine and drive within 8 hours, or work around heavy equipment or machinery as this can make you drowsy. ? ? ?Steps to find a Primary Care Provider (PCP): ? ?Call [phone] or [phone] to access "[facility] Find a Doctor Service." ? ?2.  You may also go on the [facility] website at [email or web address] ? ?
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
## 2021-10-11 ENCOUNTER — Other Ambulatory Visit: Payer: Self-pay

## 2021-10-11 DIAGNOSIS — Z20822 Contact with and (suspected) exposure to covid-19: Secondary | ICD-10-CM | POA: Insufficient documentation

## 2021-10-11 DIAGNOSIS — R45851 Suicidal ideations: Secondary | ICD-10-CM | POA: Insufficient documentation

## 2021-10-11 DIAGNOSIS — F1020 Alcohol dependence, uncomplicated: Secondary | ICD-10-CM | POA: Insufficient documentation

## 2021-10-11 DIAGNOSIS — F332 Major depressive disorder, recurrent severe without psychotic features: Secondary | ICD-10-CM | POA: Insufficient documentation

## 2021-10-11 DIAGNOSIS — Y905 Blood alcohol level of 100-119 mg/100 ml: Secondary | ICD-10-CM | POA: Insufficient documentation

## 2021-10-11 LAB — CBC
HCT: 43.7 % (ref 39.0–52.0)
Hemoglobin: 14.4 g/dL (ref 13.0–17.0)
MCH: 28.4 pg (ref 26.0–34.0)
MCHC: 33 g/dL (ref 30.0–36.0)
MCV: 86.2 fL (ref 80.0–100.0)
Platelets: 362 10*3/uL (ref 150–400)
RBC: 5.07 MIL/uL (ref 4.22–5.81)
RDW: 13 % (ref 11.5–15.5)
WBC: 9.5 10*3/uL (ref 4.0–10.5)
nRBC: 0 % (ref 0.0–0.2)

## 2021-10-11 NOTE — ED Notes (Addendum)
The following items placed in one labeled bag: orange backpack, wallet, assorted change, sweater, $62, black sneakers, jeans, black socks, underwear and I phone 13 promax.belongings taken by Kathlen Mody ed tech to quad area.  ?
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
## 2021-10-12 ENCOUNTER — Encounter: Payer: Self-pay | Admitting: Psychiatry

## 2021-10-12 ENCOUNTER — Other Ambulatory Visit: Payer: Self-pay

## 2021-10-12 ENCOUNTER — Inpatient Hospital Stay
Admission: AD | Admit: 2021-10-12 | Discharge: 2021-10-13 | DRG: 882 | Disposition: A | Discharge status: Home / Self Care | Payer: 59 | Source: Intra-hospital | Attending: Psychiatry | Admitting: Psychiatry

## 2021-10-12 ENCOUNTER — Emergency Department
Admission: EM | Admit: 2021-10-12 | Discharge: 2021-10-12 | Disposition: A | Discharge status: Home / Self Care | Payer: 59 | Attending: Emergency Medicine | Admitting: Emergency Medicine

## 2021-10-12 DIAGNOSIS — Y905 Blood alcohol level of 100-119 mg/100 ml: Secondary | ICD-10-CM | POA: Diagnosis present

## 2021-10-12 DIAGNOSIS — R45851 Suicidal ideations: Secondary | ICD-10-CM | POA: Diagnosis present

## 2021-10-12 DIAGNOSIS — F1010 Alcohol abuse, uncomplicated: Secondary | ICD-10-CM | POA: Diagnosis present

## 2021-10-12 DIAGNOSIS — Z20822 Contact with and (suspected) exposure to covid-19: Secondary | ICD-10-CM | POA: Diagnosis present

## 2021-10-12 DIAGNOSIS — F32A Depression, unspecified: Secondary | ICD-10-CM

## 2021-10-12 DIAGNOSIS — F332 Major depressive disorder, recurrent severe without psychotic features: Secondary | ICD-10-CM | POA: Diagnosis present

## 2021-10-12 DIAGNOSIS — F4325 Adjustment disorder with mixed disturbance of emotions and conduct: Secondary | ICD-10-CM | POA: Diagnosis present

## 2021-10-12 DIAGNOSIS — Z87891 Personal history of nicotine dependence: Secondary | ICD-10-CM | POA: Diagnosis not present

## 2021-10-12 DIAGNOSIS — F1020 Alcohol dependence, uncomplicated: Secondary | ICD-10-CM | POA: Diagnosis present

## 2021-10-12 DIAGNOSIS — F10920 Alcohol use, unspecified with intoxication, uncomplicated: Secondary | ICD-10-CM

## 2021-10-12 DIAGNOSIS — F1021 Alcohol dependence, in remission: Secondary | ICD-10-CM | POA: Diagnosis present

## 2021-10-12 LAB — RESP PANEL BY RT-PCR (FLU A&B, COVID) ARPGX2
Influenza A by PCR: NEGATIVE
Influenza B by PCR: NEGATIVE
SARS Coronavirus 2 by RT PCR: NEGATIVE

## 2021-10-12 LAB — COMPREHENSIVE METABOLIC PANEL
ALT: 18 U/L (ref 0–44)
AST: 21 U/L (ref 15–41)
Albumin: 4.8 g/dL (ref 3.5–5.0)
Alkaline Phosphatase: 86 U/L (ref 38–126)
Anion gap: 10 (ref 5–15)
BUN: 11 mg/dL (ref 6–20)
CO2: 23 mmol/L (ref 22–32)
Calcium: 9.5 mg/dL (ref 8.9–10.3)
Chloride: 108 mmol/L (ref 98–111)
Creatinine, Ser: 0.67 mg/dL (ref 0.61–1.24)
GFR, Estimated: >60 mL/min (ref >60)
Glucose, Bld: 85 mg/dL (ref 70–99)
Potassium: 3.4 mmol/L — ABNORMAL LOW (ref 3.5–5.1)
Sodium: 141 mmol/L (ref 135–145)
Total Bilirubin: 0.5 mg/dL (ref 0.3–1.2)
Total Protein: 8.2 g/dL — ABNORMAL HIGH (ref 6.5–8.1)

## 2021-10-12 LAB — SALICYLATE LEVEL: Salicylate Lvl: <7 mg/dL — ABNORMAL LOW (ref 7.0–30.0)

## 2021-10-12 LAB — URINE DRUG SCREEN, QUALITATIVE (ARMC ONLY)
Amphetamines, Ur Screen: NOT DETECTED
Barbiturates, Ur Screen: NOT DETECTED
Benzodiazepine, Ur Scrn: NOT DETECTED
Cannabinoid 50 Ng, Ur ~~LOC~~: NOT DETECTED
Cocaine Metabolite,Ur ~~LOC~~: NOT DETECTED
MDMA (Ecstasy)Ur Screen: NOT DETECTED
Methadone Scn, Ur: NOT DETECTED
Opiate, Ur Screen: NOT DETECTED
Phencyclidine (PCP) Ur S: NOT DETECTED
Tricyclic, Ur Screen: NOT DETECTED

## 2021-10-12 LAB — ETHANOL: Alcohol, Ethyl (B): 118 mg/dL — ABNORMAL HIGH (ref <10)

## 2021-10-12 LAB — ACETAMINOPHEN LEVEL: Acetaminophen (Tylenol), Serum: <10 ug/mL — ABNORMAL LOW (ref 10–30)

## 2021-10-12 MED ORDER — LORAZEPAM 1 MG PO TABS: 1.0000 mg | ORAL_TABLET | Freq: Every day | ORAL | Status: DC

## 2021-10-12 MED ORDER — ONDANSETRON 4 MG PO TBDP: 4.0000 mg | ORAL_TABLET | Freq: Four times a day (QID) | ORAL | Status: DC | PRN

## 2021-10-12 MED ORDER — LORAZEPAM 1 MG PO TABS: 1.0000 mg | ORAL_TABLET | Freq: Two times a day (BID) | ORAL | Status: DC

## 2021-10-12 MED ORDER — MAGNESIUM HYDROXIDE 400 MG/5ML PO SUSP: 30.0000 mL | Freq: Every day | ORAL | Status: DC | PRN

## 2021-10-12 MED ORDER — LORAZEPAM 1 MG PO TABS: 1.0000 mg | ORAL_TABLET | Freq: Three times a day (TID) | ORAL | Status: DC

## 2021-10-12 MED ORDER — THIAMINE HCL 100 MG PO TABS
100.0000 mg | ORAL_TABLET | Freq: Every day | ORAL | Status: DC
Start: 2021-10-13 — End: 2021-10-13
  Filled 2021-10-12: qty 1

## 2021-10-12 MED ORDER — LORAZEPAM 1 MG PO TABS: 1.0000 mg | ORAL_TABLET | Freq: Four times a day (QID) | ORAL | Status: DC | PRN

## 2021-10-12 MED ORDER — LOPERAMIDE HCL 2 MG PO CAPS: 2.0000 mg | ORAL_CAPSULE | ORAL | Status: DC | PRN

## 2021-10-12 MED ORDER — ADULT MULTIVITAMIN W/MINERALS CH
1.0000 | ORAL_TABLET | Freq: Every day | ORAL | Status: DC
  Filled 2021-10-12: qty 1

## 2021-10-12 MED ORDER — LORAZEPAM 1 MG PO TABS
1.0000 mg | ORAL_TABLET | Freq: Four times a day (QID) | ORAL | Status: DC
  Filled 2021-10-12: qty 1

## 2021-10-12 MED ORDER — ALUM & MAG HYDROXIDE-SIMETH 200-200-20 MG/5ML PO SUSP: 30.0000 mL | ORAL | Status: DC | PRN

## 2021-10-12 MED ORDER — HYDROXYZINE HCL 25 MG PO TABS: 25.0000 mg | ORAL_TABLET | Freq: Four times a day (QID) | ORAL | Status: DC | PRN

## 2021-10-12 MED ORDER — THIAMINE HCL 100 MG/ML IJ SOLN
100.0000 mg | Freq: Once | INTRAMUSCULAR | Status: DC
  Filled 2021-10-12: qty 1

## 2021-10-12 MED ORDER — LORAZEPAM 1 MG PO TABS
1.0000 mg | ORAL_TABLET | Freq: Four times a day (QID) | ORAL | Status: AC
  Administered 2021-10-12: 1 mg via ORAL
  Filled 2021-10-12 (×2): qty 1

## 2021-10-12 MED ORDER — ACETAMINOPHEN 325 MG PO TABS: 650.0000 mg | ORAL_TABLET | Freq: Four times a day (QID) | ORAL | Status: DC | PRN

## 2021-10-12 MED ORDER — THIAMINE HCL 100 MG PO TABS: 100.0000 mg | ORAL_TABLET | Freq: Every day | ORAL | Status: DC

## 2021-10-12 MED ORDER — CYCLOBENZAPRINE HCL 5 MG PO TABS
7.5000 mg | ORAL_TABLET | Freq: Two times a day (BID) | ORAL | Status: DC | PRN
  Filled 2021-10-12: qty 1.5

## 2021-10-12 NOTE — ED Notes (Signed)
Moved to B[facility]-1 

## 2021-10-12 NOTE — ED Notes (Signed)
RN explained to pt he would be admitted to the inpatient unit.  Pt minimizes the situation that lead to him coming to the ER.  Pt concerned about his dogs at home.   ?

## 2021-10-12 NOTE — Progress Notes (Signed)
Patient presents anxious, denies any SI, HI, AVH. Noted in dayroom with peers, minimal interaction. Pleasant and cooperative. Medication compliant. Prn given for anxiety with good relief.  ?Encouragement and support provided. Safety checks maintained. Medications given as prescribed. Pt receptive and remains safe on unit with q 15 min checks. ?

## 2021-10-12 NOTE — Consult Note (Signed)
[facility] Face-to-Face Psychiatry Consult  ? ?Reason for Consult:  suicide threat ?Referring Physician:  EDP ?Patient Identification: Henry Garcia ?MRN:  [id number] ?Principal Diagnosis: Major depressive disorder, recurrent severe without psychotic features (HCC) ?Diagnosis:  Principal Problem: ?  Major depressive disorder, recurrent severe without psychotic features (HCC) ?Active Problems: ?  Alcohol dependence (HCC) ? ? ?Total Time spent with patient: 45 minutes ? ?Subjective:   ?Henry Garcia is a 26 y.o. male patient admitted with suicide threat.  "I was stressing." ? ?HPI:  26 yo male presents after threatening suicide when drinking alcohol, gun was beside him.  He stopped drinking for two months, restarted last week related to stressors.  His wife left him with their two children (1 and 27 yo), alcohol was a factor, and his work is stressful, manual labor at a plant.  No support system.  Denies psychosis, paranoia, and other substance use.  Psychiatric admission recommended to stabilize. ? ?Past Psychiatric History: depression, alcohol dependence, anxiety ? ?Risk to Self:  yes ?Risk to Others:  none ?Prior Inpatient Therapy:  none ?Prior Outpatient Therapy:  none ? ?Past Medical History:  ?Past Medical History:  ?Diagnosis Date  ? Heart murmur   ? Wolff-Parkinson-White syndrome   ?  ?Past Surgical History:  ?Procedure Laterality Date  ? ELECTROPHYSIOLOGIC STUDY    ? 08/2018  ? HAND SURGERY Right   ? 5th finger, 2012 ad 2013  ? TONSILLECTOMY    ? ?Family History: No family history on file. ?Family Psychiatric  History: none ?Social History:  ?Social History  ? ?Substance and Sexual Activity  ?Alcohol Use Yes  ? Alcohol/week: 21.0 standard drinks  ? Types: 21 Shots of liquor per week  ?   ?Social History  ? ?Substance and Sexual Activity  ?Drug Use No  ?  ?Social History  ? ?Socioeconomic History  ? Marital status: Married  ?  Spouse name: Not on file  ? Number of children: Not on file  ? Years of education: Not on  file  ? Highest education level: Not on file  ?Occupational History  ? Not on file  ?Tobacco Use  ? Smoking status: Former  ?  Types: Cigarettes  ? Smokeless tobacco: Never  ?Vaping Use  ? Vaping Use: Never used  ?Substance and Sexual Activity  ? Alcohol use: Yes  ?  Alcohol/week: 21.0 standard drinks  ?  Types: 21 Shots of liquor per week  ? Drug use: No  ? Sexual activity: Not on file  ?Other Topics Concern  ? Not on file  ?Social History Narrative  ? Not on file  ? ?Social Determinants of Health  ? ?Financial Resource Strain: Not on file  ?Food Insecurity: Not on file  ?Transportation Needs: Not on file  ?Physical Activity: Not on file  ?Stress: Not on file  ?Social Connections: Not on file  ? ?Additional Social History: ?  ? ?Allergies:   ?Allergies  ?Allergen Reactions  ? Amoxicillin Itching  ? Clarithromycin Itching  ? Cortisone Other (See Comments)  ?  hiccups  ? ? ?Labs:  ?Results for orders placed or performed during the hospital encounter of 10/12/21 (from the past 48 hour(s))  ?Urine Drug Screen, Qualitative     Status: None  ? Collection Time: 10/11/21 11:18 PM  ?Result Value Ref Range  ? Tricyclic, Ur Screen NONE DETECTED NONE DETECTED  ? Amphetamines, Ur Screen NONE DETECTED NONE DETECTED  ? MDMA (Ecstasy)Ur Screen NONE DETECTED NONE DETECTED  ? Cocaine Metabolite,Ur [place] NONE  DETECTED NONE DETECTED  ? Opiate, Ur Screen NONE DETECTED NONE DETECTED  ? Phencyclidine (PCP) Ur S NONE DETECTED NONE DETECTED  ? Cannabinoid 50 Ng, Ur  NONE DETECTED NONE DETECTED  ? Barbiturates, Ur Screen NONE DETECTED NONE DETECTED  ? Benzodiazepine, Ur Scrn NONE DETECTED NONE DETECTED  ? Methadone Scn, Ur NONE DETECTED NONE DETECTED  ?  Comment: (NOTE) ?Tricyclics + metabolites, urine    Cutoff 1000 ng/mL ?Amphetamines + metabolites, urine  Cutoff 1000 ng/mL ?MDMA (Ecstasy), urine              Cutoff 500 ng/mL ?Cocaine Metabolite, urine          Cutoff 300 ng/mL ?Opiate + metabolites, urine        Cutoff 300  ng/mL ?Phencyclidine (PCP), urine         Cutoff 25 ng/mL ?Cannabinoid, urine                 Cutoff 50 ng/mL ?Barbiturates + metabolites, urine  Cutoff 200 ng/mL ?Benzodiazepine, urine              Cutoff 200 ng/mL ?Methadone, urine                   Cutoff 300 ng/mL ? ?The urine drug screen provides only a preliminary, unconfirmed ?analytical test result and should not be used for non-medical ?purposes. Clinical consideration and professional judgment should ?be applied to any positive drug screen result due to possible ?interfering substances. A more specific alternate chemical method ?must be used in order to obtain a confirmed analytical result. ?Gas chromatography / mass spectrometry (GC/MS) is the preferred ?confirm atory method. ?Performed at [facility], [place], ?[place] [address] ?  ?Comprehensive metabolic panel     Status: Abnormal  ? Collection Time: 10/11/21 11:30 PM  ?Result Value Ref Range  ? Sodium 141 135 - 145 mmol/L  ? Potassium 3.4 (L) 3.5 - 5.1 mmol/L  ? Chloride 108 98 - 111 mmol/L  ? CO2 23 22 - 32 mmol/L  ? Glucose, Bld 85 70 - 99 mg/dL  ?  Comment: Glucose reference range applies only to samples taken after fasting for at least 8 hours.  ? BUN 11 6 - 20 mg/dL  ? Creatinine, Ser 0.67 0.61 - 1.24 mg/dL  ? Calcium 9.5 8.9 - 10.3 mg/dL  ? Total Protein 8.2 (H) 6.5 - 8.1 g/dL  ? Albumin 4.8 3.5 - 5.0 g/dL  ? AST 21 15 - 41 U/L  ? ALT 18 0 - 44 U/L  ? Alkaline Phosphatase 86 38 - 126 U/L  ? Total Bilirubin 0.5 0.3 - 1.2 mg/dL  ? GFR, Estimated >60 >60 mL/min  ?  Comment: (NOTE) ?Calculated using the CKD-EPI Creatinine Equation (2021) ?  ? Anion gap 10 5 - 15  ?  Comment: Performed at [facility], [address] [place], [place] [address]  ?Ethanol     Status: Abnormal  ? Collection Time: 10/11/21 11:30 PM  ?Result Value Ref Range  ? Alcohol, Ethyl (B) 118 (H) <10 mg/dL  ?  Comment: (NOTE) ?Lowest detectable limit for serum alcohol is 10 mg/dL. ? ?For  medical purposes only. ?Performed at [facility], [place], ?[place] [address] ?  ?Salicylate level     Status: Abnormal  ? Collection Time: 10/11/21 11:30 PM  ?Result Value Ref Range  ? Salicylate Lvl [id number] (L) 7.0 - 30.0 mg/dL  ?  Comment: Performed  at [facility], [address] [place], [place] [address]  ?Acetaminophen level     Status: Abnormal  ? Collection Time: 10/11/21 11:30 PM  ?Result Value Ref Range  ? Acetaminophen (Tylenol), Serum <10 (L) 10 - 30 ug/mL  ?  Comment: (NOTE) ?Therapeutic concentrations vary significantly. A range of 10-30 ug/mL  ?may be an effective concentration for many patients. However, some  ?are best treated at concentrations outside of this range. ?Acetaminophen concentrations >150 ug/mL at 4 hours after ingestion  ?and >50 ug/mL at 12 hours after ingestion are often associated with  ?toxic reactions. ? ?Performed at [facility], [place], ?[place] [address] ?  ?cbc     Status: None  ? Collection Time: 10/11/21 11:30 PM  ?Result Value Ref Range  ? WBC 9.5 4.0 - 10.5 K/uL  ? RBC 5.07 4.22 - 5.81 MIL/uL  ? Hemoglobin 14.4 13.0 - 17.0 g/dL  ? HCT 43.7 39.0 - 52.0 %  ? MCV 86.2 80.0 - 100.0 fL  ? MCH 28.4 26.0 - 34.0 pg  ? MCHC 33.0 30.0 - 36.0 g/dL  ? RDW 13.0 11.5 - 15.5 %  ? Platelets 362 150 - 400 K/uL  ? nRBC 0.0 0.0 - 0.2 %  ?  Comment: Performed at [facility], [address] [place], [place] [address]  ?Resp Panel by RT-PCR (Flu A&B, Covid) Nasopharyngeal Swab     Status: None  ? Collection Time: 10/12/21  3:36 AM  ? Specimen: Nasopharyngeal Swab; Nasopharyngeal(NP) swabs in vial transport medium  ?Result Value Ref Range  ? SARS Coronavirus 2 by RT PCR NEGATIVE NEGATIVE  ?  Comment: (NOTE) ?SARS-CoV-2 target nucleic acids are NOT DETECTED. ? ?The SARS-CoV-2 RNA is generally detectable in upper respiratory ?specimens during the acute phase of infection. The lowest ?concentration of SARS-CoV-2 viral  copies this assay can detect is ?138 copies/mL. A negative result does not preclude SARS-Cov-2 ?infection and should not be used as the sole basis for treatment or ?other patient management decisions. A negative result may occur

## 2021-10-12 NOTE — ED Notes (Signed)
Pt given graham crackers, pt had cup of water already, explained delay in rooming, pt verbalized understanding, pt resting comfortably in recliner.  ?

## 2021-10-12 NOTE — Plan of Care (Signed)
  Problem: Coping: Goal: Coping ability will improve Outcome: Progressing   Problem: Safety: Goal: Ability to disclose and discuss suicidal ideas will improve Outcome: Progressing   

## 2021-10-12 NOTE — ED Notes (Signed)
Pt resting in recliner in triage, q15 min safety checks being performed by this RN until room available.  ?
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
## 2021-10-12 NOTE — ED Notes (Signed)
Dr. Toni Amend made aware of the conversation this RN had with the pt's father.  ?

## 2021-10-12 NOTE — ED Provider Notes (Signed)
? ?[facility] ?Provider Note ? ? ? Event Date/Time  ? First MD Initiated Contact with Patient 10/12/21 0037   ?  (approximate) ? ? ?History  ? ?Suicidal ? ? ?HPI ? ?Henry Garcia is a 26 y.o. male brought to the ED voluntarily with BPD for suicidal ideation.  Patient reports he had his handgun next to him and contemplated suicide for the past several hours.  Denies active HI/AH/VH.  Voices no medical complaints. ?  ? ? ?Past Medical History  ? ?Past Medical History:  ?Diagnosis Date  ? Heart murmur   ? Wolff-Parkinson-White syndrome   ? ? ? ?Active Problem List  ?There are no problems to display for this patient. ? ? ? ?Past Surgical History  ? ?Past Surgical History:  ?Procedure Laterality Date  ? ELECTROPHYSIOLOGIC STUDY    ? 08/2018  ? HAND SURGERY Right   ? 5th finger, 2012 ad 2013  ? TONSILLECTOMY    ? ? ? ?Home Medications  ? ?Prior to Admission medications   ?Medication Sig Start Date End Date Taking? Authorizing Provider  ?B Complex-C-Folic Acid (B COMPLEX-VITAMIN C-FOLIC ACID) 1 MG tablet Take 1 tablet by mouth daily with breakfast. 03/29/21   Ward, Layla Maw, DO  ?cyclobenzaprine (FEXMID) 7.5 MG tablet Take 1 tablet (7.5 mg total) by mouth 2 (two) times daily as needed for muscle spasms. 09/30/21   Sharyn Creamer, MD  ?thiamine 100 MG tablet Take 1 tablet (100 mg total) by mouth daily. 03/29/21   Ward, Layla Maw, DO  ? ? ? ?Allergies  ?Amoxicillin, Clarithromycin, and Cortisone ? ? ?Family History  ?No family history on file. ? ? ?Physical Exam  ?Triage Vital Signs: ?ED Triage Vitals  ?Enc Vitals Group  ?   BP 10/11/21 2325 139/80  ?   Pulse Rate 10/11/21 2325 (!) 109  ?   Resp 10/11/21 2325 14  ?   Temp 10/11/21 2325 98.2 ?F (36.8 ?C)  ?   Temp Source 10/11/21 2325 Oral  ?   SpO2 10/11/21 2325 97 %  ?   Weight 10/11/21 2326 140 lb (63.5 kg)  ?   Height 10/11/21 2326 5\' 5"  (1.651 m)  ?   Head Circumference --   ?   Peak Flow --   ?   Pain Score 10/11/21 2325 0  ?   Pain Loc --   ?   Pain  Edu? --   ?   Excl. in GC? --   ? ? ?Updated Vital Signs: ?BP 139/80   Pulse (!) 109   Temp 98.2 ?F (36.8 ?C) (Oral)   Resp 14   Ht 5\' 5"  (1.651 m)   Wt 63.5 kg   SpO2 97%   BMI 23.30 kg/m?  ? ? ?General: Awake, no distress.  ?CV:  Tachycardic.  Good peripheral perfusion.  ?Resp:  Normal effort.  CTAB. ?Abd:  Nontender.  No distention.  ?Other:  Laughing inappropriately ? ? ?ED Results / Procedures / Treatments  ?Labs ?(all labs ordered are listed, but only abnormal results are displayed) ?Labs Reviewed  ?COMPREHENSIVE METABOLIC PANEL - Abnormal; Notable for the following components:  ?    Result Value  ? Potassium 3.4 (*)   ? Total Protein 8.2 (*)   ? All other components within normal limits  ?ETHANOL - Abnormal; Notable for the following components:  ? Alcohol, Ethyl (B) 118 (*)   ? All other components within normal limits  ?SALICYLATE LEVEL - Abnormal; Notable for  the following components:  ? Salicylate Lvl <7.0 (*)   ? All other components within normal limits  ?ACETAMINOPHEN LEVEL - Abnormal; Notable for the following components:  ? Acetaminophen (Tylenol), Serum <10 (*)   ? All other components within normal limits  ?CBC  ?URINE DRUG SCREEN, QUALITATIVE ([facility] ONLY)  ? ? ? ?EKG ? ?None ? ? ?RADIOLOGY ?None ? ? ?Official radiology report(s): ?No results found. ? ? ?PROCEDURES: ? ?Critical Care performed: No ? ?Procedures ? ? ?MEDICATIONS ORDERED IN ED: ?Medications - No data to display ? ? ?IMPRESSION / MDM / ASSESSMENT AND PLAN / ED COURSE  ?I reviewed the triage vital signs and the nursing notes. ?             ?               ?26 year old male brought for suicidal ideation with access to a handgun.  Unfortunately there are no psychiatric services overnight.  Will place patient under IVC for his safety pending psychiatry evaluation in the morning. The patient has been placed in psychiatric observation due to the need to provide a safe environment for the patient while obtaining psychiatric consultation  and evaluation, as well as ongoing medical and medication management to treat the patient's condition.  The patient has been placed under full IVC at this time. ? ?FINAL CLINICAL IMPRESSION(S) / ED DIAGNOSES  ? ?Final diagnoses:  ?Depression, unspecified depression type  ?Alcoholic intoxication without complication (HCC)  ? ? ? ?Rx / DC Orders  ? ?ED Discharge Orders   ? ? None  ? ?  ? ? ? ?Note:  This document was prepared using Dragon voice recognition software and may include unintentional dictation errors. ?  ?Irean Hong, MD ?10/12/21 [phone] ? ?

## 2021-10-12 NOTE — Tx Team (Signed)
Initial Treatment Plan ?10/12/2021 ?3:48 PM ?Henry Garcia ?[id number] ? ? ? ?PATIENT STRESSORS: ?Marital or family conflict   ?Substance abuse   ? ? ?PATIENT STRENGTHS: ?Average or above average intelligence  ?Capable of independent living  ?Communication skills  ?Motivation for treatment/growth  ? ? ?PATIENT IDENTIFIED PROBLEMS: ?Family conflicts   ?Alcohol abuse   ?No support system  ?  ?  ?  ?  ?  ?  ?  ? ?DISCHARGE CRITERIA:  ?Adequate post-discharge living arrangements ?Medical problems require only outpatient monitoring ?Motivation to continue treatment in a less acute level of care ?Verbal commitment to aftercare and medication compliance ? ?PRELIMINARY DISCHARGE PLAN: ?Attend aftercare/continuing care group ?Return to previous living arrangement ?Return to previous work or school arrangements ? ?PATIENT/FAMILY INVOLVEMENT: ?This treatment plan has been presented to and reviewed with the patient, Henry Garcia, and/or family member,  The patient and family have been given the opportunity to ask questions and make suggestions. ? ?Leonarda Salon, RN ?10/12/2021, 3:48 PM ?

## 2021-10-12 NOTE — ED Notes (Signed)
Pt. Transferred to BHU from ED to room 1 after screening for contraband. Report to include Situation, Background, Assessment and Recommendations from Dawn RN. Pt. Oriented to unit including Q15 minute rounds as well as the security cameras for their protection. Patient is alert and oriented, warm and dry in no acute distress. Patient denies SI, HI, and AVH. Pt. Encouraged to let me know if needs arise.  

## 2021-10-12 NOTE — BH Assessment (Signed)
Patient is to be admitted to [facility] BMU today 10/12/21 at 2:00pm by Dr. Toni Amend.  ?Attending Physician will be Dr.  Toni Amend .   ?Patient has been assigned to room 307, by [facility] Charge Nurse Gigi.   ?  ?ER staff is aware of the admission: ?Misty Stanley, ER Secretary   ?Dr. Sidney Ace, ER MD  ?Amy, Patient's Nurse  ?Sue Lush, Patient Access.  ?

## 2021-10-12 NOTE — ED Notes (Signed)
Pt's father called asking for the patient to be discharged.  Father stated, "I will take full responsibility for his safety."   RN explained the patient was going to be transferred to the inpatient unit within the hour.   ? ?[phone] ?

## 2021-10-12 NOTE — ED Notes (Signed)
IVC/pending psych consult 

## 2021-10-12 NOTE — ED Notes (Signed)
IVC/Psych Consult ordered/Pending/No TTS/NP Coverage ?

## 2021-10-12 NOTE — ED Notes (Signed)
Lab called to run urine that was sent with save labels.  ?

## 2021-10-12 NOTE — ED Notes (Signed)
Patient resting quietly in room. No noted distress or abnormal behaviors noted. Will continue 15 minute checks and observation by security camera for safety. 

## 2021-10-12 NOTE — BH Assessment (Signed)
Comprehensive Clinical Assessment (CCA) Note  10/12/2021 Henry Garcia [id number]  Irish Elders, 26 year old male who presents to [facility] ED involuntarily for treatment. Per triage note, Pt states has been suicidal for last several hours. Pt states he had his handgun "next to me" and his wife reached out to the police. Pt is here voluntary with BPD who state are not going to take out IVC papers.   During TTS assessment pt presents alert and oriented x 4, restless but cooperative, and mood-congruent with affect. The pt does not appear to be responding to internal or external stimuli. Neither is the pt presenting with any delusional thinking. Pt verified the information provided to triage RN.   Pt identifies his main complaint to be that "life just took over." Patient reports he has been stressed and feeling overwhelmed for the past couple of weeks. Patient reports he has been self-medicating with alcohol. Patient states he relapsed after being clean for 2 months. Patient reports he and his wife are currently separated due to his drinking. Patient states he does not have any familial support and talks to his friends occasionally. Pt denies current SI/HI/AH/VH. Pt contracts for safety.    Per Asher Muir, NP pt is recommended for inpatient psychiatric admission.  Chief Complaint:  Chief Complaint  Patient presents with   Suicidal   Visit Diagnosis: Major depressive disorder, recurrent severe without psychotic features    CCA Screening, Triage and Referral (STR)  Patient Reported Information How did you hear about [place]? -- Mudlogger)  Referral name: No data recorded Referral phone number: No data recorded  Whom do you see for routine medical problems? No data recorded Practice/Facility Name: No data recorded Practice/Facility Phone Number: No data recorded Name of Contact: No data recorded Contact Number: No data recorded Contact Fax Number: No data recorded Prescriber Name: No data  recorded Prescriber Address (if known): No data recorded  What Is the Reason for Your Visit/Call Today? Patient voluntarily came to ED due to [facility] and concerns from family.  How Long Has This Been Causing You Problems? <Week  What Do You Feel Would Help You the Most Today? -- (Assessment only)   Have You Recently Been in Any Inpatient Treatment (Hospital/Detox/Crisis Center/28-Day Program)? No data recorded Name/Location of Program/Hospital:No data recorded How Long Were You There? No data recorded When Were You Discharged? No data recorded  Have You Ever Received Services From [facility] Before? No data recorded Who Do You See at [facility]? No data recorded  Have You Recently Had Any Thoughts About Hurting Yourself? Yes  Are You Planning to Commit Suicide/Harm Yourself At This time? No   Have you Recently Had Thoughts About Hurting Someone Karolee Ohs? No  Explanation: No data recorded  Have You Used Any Alcohol or Drugs in the Past 24 Hours? Yes  How Long Ago Did You Use Drugs or Alcohol? No data recorded What Did You Use and How Much? Alcohol- unknown   Do You Currently Have a Therapist/Psychiatrist? No  Name of Therapist/Psychiatrist: No data recorded  Have You Been Recently Discharged From Any Office Practice or Programs? No  Explanation of Discharge From Practice/Program: No data recorded    CCA Screening Triage Referral Assessment Type of Contact: Face-to-Face  Is this Initial or Reassessment? No data recorded Date Telepsych consult ordered in [facility]:  No data recorded Time Telepsych consult ordered in [facility]:  No data recorded  Patient Reported Information Reviewed? No data recorded Patient Left Without Being Seen? No  data recorded Reason for Not Completing Assessment: No data recorded  Collateral Involvement: Lesly- wife   Does Patient Have a Automotive engineer Guardian? No data recorded Name and Contact of Legal Guardian: No data recorded If Minor and Not  Living with Parent(s), Who has Custody? n/a  Is CPS involved or ever been involved? Never  Is APS involved or ever been involved? Never   Patient Determined To Be At Risk for Harm To Self or Others Based on Review of Patient Reported Information or Presenting Complaint? No  Method: No data recorded Availability of Means: No data recorded Intent: No data recorded Notification Required: No data recorded Additional Information for Danger to Others Potential: No data recorded Additional Comments for Danger to Others Potential: No data recorded Are There Guns or Other Weapons in Your Home? No data recorded Types of Guns/Weapons: No data recorded Are These Weapons Safely Secured?                            No data recorded Who Could Verify You Are Able To Have These Secured: No data recorded Do You Have any Outstanding Charges, Pending Court Dates, Parole/Probation? No data recorded Contacted To Inform of Risk of Harm To Self or Others: No data recorded  Location of Assessment: [facility]   Does Patient Present under Involuntary Commitment? Yes  IVC Papers Initial File Date: 10/12/21   [place] of Residence: [place]   Patient Currently Receiving the Following Services: Not Receiving Services   Determination of Need: Urgent (48 hours)   Options For Referral: Inpatient Hospitalization; ED Visit     CCA Biopsychosocial Intake/Chief Complaint:  No data recorded Current Symptoms/Problems: No data recorded  Patient Reported Schizophrenia/Schizoaffective Diagnosis in Past: No   Strengths: Patient able to communicate and vebalize needs.  Preferences: No data recorded Abilities: No data recorded  Type of Services Patient Feels are Needed: No data recorded  Initial Clinical Notes/Concerns: No data recorded  Mental Health Symptoms Depression:   Change in energy/activity; Difficulty Concentrating   Duration of Depressive symptoms:  Less than two weeks   Mania:   None    Anxiety:    Difficulty concentrating; Tension   Psychosis:   None   Duration of Psychotic symptoms: No data recorded  Trauma:   None   Obsessions:   None   Compulsions:   None   Inattention:   None   Hyperactivity/Impulsivity:   None   Oppositional/Defiant Behaviors:   None   Emotional Irregularity:   Potentially harmful impulsivity; Intense/unstable relationships   Other Mood/Personality Symptoms:  No data recorded   Mental Status Exam Appearance and self-care  Stature:   Average   Weight:   Average weight   Clothing:   Casual   Grooming:   Normal   Cosmetic use:   None   Posture/gait:   Normal   Motor activity:   Not Remarkable   Sensorium  Attention:   Normal   Concentration:   Normal   Orientation:   X5   Recall/memory:   Normal   Affect and Mood  Affect:   Anxious; Restricted   Mood:   Anxious   Relating  Eye contact:   Normal   Facial expression:  No data recorded  Attitude toward examiner:   Cooperative; Guarded   Thought and Language  Speech flow:  Clear and Coherent   Thought content:   Appropriate to Mood and Circumstances   Preoccupation:  None   Hallucinations:   None   Organization:  No data recorded  [employer] of Knowledge:   Average   Intelligence:   Average   Abstraction:   Normal   Judgement:   Good   Reality Testing:   Adequate   Insight:   Denial   Decision Making:   Impulsive   Social Functioning  Social Maturity:   Irresponsible   Social Judgement:   Victimized   Stress  Stressors:   Work; Family conflict; Relationship   Coping Ability:   Overwhelmed   Skill Deficits:   Decision making; Interpersonal   Supports:   Family     Religion:    Leisure/Recreation:    Exercise/Diet:     CCA Employment/Education Employment/Work Situation: Employment / Work Situation Employment Situation: Employed Work Stressors:  Copywriter, advertising:     CCA Family/Childhood History Family and Relationship History: Family history Marital status: Separated What types of issues is patient dealing with in the relationship?: Patient reports his drinking caused issues within his marriage. Does patient have children?: Yes How many children?: 2  Childhood History:     Child/Adolescent Assessment:     CCA Substance Use Alcohol/Drug Use: Alcohol / Drug Use Pain Medications: See PTA Prescriptions: See PTA Over the Counter: See PTA History of alcohol / drug use?: Yes Longest period of sobriety (when/how long): Unknown Negative Consequences of Use: Personal relationships, Work / School Substance #1 Name of Substance 1: Alcohol 1 - Age of First Use: unknown 1 - Last Use / Amount: 10/10/21- unknown 1- Route of Use: oral                       ASAM's:  Six Dimensions of Multidimensional Assessment  Dimension 1:  Acute Intoxication and/or Withdrawal Potential:      Dimension 2:  Biomedical Conditions and Complications:      Dimension 3:  Emotional, Behavioral, or Cognitive Conditions and Complications:     Dimension 4:  Readiness to Change:     Dimension 5:  Relapse, Continued use, or Continued Problem Potential:     Dimension 6:  Recovery/Living Environment:     ASAM Severity Score:    ASAM Recommended Level of Treatment:     Substance use Disorder (SUD) Substance Use Disorder (SUD)  Checklist Symptoms of Substance Use: Continued use despite having a persistent/recurrent physical/psychological problem caused/exacerbated by use, Continued use despite persistent or recurrent social, interpersonal problems, caused or exacerbated by use, Recurrent use that results in a failure to fulfill major role obligations (work, school, home)  Recommendations for Services/Supports/Treatments: Recommendations for Services/Supports/Treatments Recommendations For Services/Supports/Treatments: Inpatient  Hospitalization  DSM5 Diagnoses: Patient Active Problem List   Diagnosis Date Noted   Major depressive disorder, recurrent severe without psychotic features (HCC) 10/12/2021   Alcohol dependence (HCC) 10/12/2021    Patient Centered Plan: Patient is on the following Treatment Plan(s):  Depression and Substance Abuse   Referrals to Alternative Service(s): Referred to Alternative Service(s):   Place:   Date:   Time:    Referred to Alternative Service(s):   Place:   Date:   Time:    Referred to Alternative Service(s):   Place:   Date:   Time:    Referred to Alternative Service(s):   Place:   Date:   Time:      @BHCOLLABOFCARE @  Leighton Luster R Theatre manager, Counselor, LCAS-A

## 2021-10-12 NOTE — Progress Notes (Signed)
26 years old male patient admitted from ED with Major depressive disorder. Patient stated that he is here because his wife called the police for  she thought he was suicidal. Patient married for 9 years and have 2 kids. Patient and his wife separated because of his drinking. Patient stated that he was sober for 2 months. He had 6 bottles of beer last night. Patient lives by himself. Patient employed for a year. Patient denies SI,HI and AVH at this time. Cooperative with admission assessment. Skin assessment and body search done with [facility] RN. No contraband found. Oriented to unit and made comfortable in room. Support and encouragement given. ?

## 2021-10-12 NOTE — ED Notes (Signed)
Pt discharged to BMU.  VS stable. Belongings sent with patient. Pt cooperative.  ?

## 2021-10-13 DIAGNOSIS — F4325 Adjustment disorder with mixed disturbance of emotions and conduct: Secondary | ICD-10-CM | POA: Diagnosis not present

## 2021-10-13 DIAGNOSIS — F1010 Alcohol abuse, uncomplicated: Secondary | ICD-10-CM
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
## 2021-10-13 NOTE — Progress Notes (Addendum)
?  [facility] Adult Case Management Discharge Plan : ? ?Will you be returning to the same living situation after discharge:  Yes,  patient will be returning home to his wife and kids. ?At discharge, do you have transportation home?: Yes,  patient stated that his wife will come to get him. ?Do you have the ability to pay for your medications: Yes,  patient stated he will have no issue getting his medication  ? ?Release of information consent forms completed and in the chart;  Patient's signature needed at discharge. ? ?Patient to Follow up at: ? Follow-up Information   ? ? [facility] Follow up.   ?Why: your appointment is 10/16/2021 @ 11am  ?walk - in M-F 8am - 5pm, state that since you're a recent discharge and you will be priority once you get there. ?Contact information: ?[address] ?[place] [address] ?[phone] ? ? ?  ?  ? ?  ?  ? ?  ? ? ?Next level of care provider has access to [facility] Link:no ? ?Safety Planning and Suicide Prevention discussed: Yes,  MSW spoke with patients wife.  ? ?  ? ?Has patient been referred to the Quitline?: Patient refused referral ? ?Patient has been referred for addiction treatment: Pt. refused referral ? ?Rosezella [place], Student-Social Work ?10/13/2021, 11:15 AM ?

## 2021-10-13 NOTE — Progress Notes (Signed)
Discharge Note: ? ?Patient denies SI/HI/AVH at this time. Discharge instructions, AVS, transition record, and return to work note gone over with patient. Patient agrees to comply with follow-up visit and outpatient therapy. Patient had no belongings sheet in his chart, however, there was a belongings bag in the search room, so this writer returned that bag to patient. Patient questions and concerns addressed and answered. Patient ambulatory off unit. Patient discharged to home with his wife.  ? ?

## 2021-10-13 NOTE — Discharge Summary (Signed)
Physician Discharge Summary Note ? ?Patient:  Henry Garcia is an 26 y.o., male ?MRN:  [id number] ?DOB:  1995-08-30 ?Patient phone:  [phone] (home)  ?Patient address:   ?[address] ?[place] [address],  ?Total Time spent with patient: 1 hour ? ?Date of Admission:  10/12/2021 ?Date of Discharge: 10/13/2021 ? ?Reason for Admission: Patient was admitted after presenting to the ER under involuntary commitment papers because of suicidal statement he made while intoxicated ? ?Principal Problem: Adjustment disorder with mixed disturbance of emotions and conduct ?Discharge Diagnoses: Principal Problem: ?  Adjustment disorder with mixed disturbance of emotions and conduct ?Active Problems: ?  Alcohol abuse ? ? ?Past Psychiatric History: Patient has a past history of alcohol abuse but not of engaging in specific substance abuse treatment.  He has no past history of suicide attempts or treatment for mental health problems or medication other than what sounds like some therapy through a employment program in the past ? ?Past Medical History:  ?Past Medical History:  ?Diagnosis Date  ? Heart murmur   ? Wolff-Parkinson-White syndrome   ?  ?Past Surgical History:  ?Procedure Laterality Date  ? ELECTROPHYSIOLOGIC STUDY    ? 08/2018  ? HAND SURGERY Right   ? 5th finger, 2012 ad 2013  ? TONSILLECTOMY    ? ?Family History: History reviewed. No pertinent family history. ?Family Psychiatric  History: Multiple people with alcohol abuse ?Social History:  ?Social History  ? ?Substance and Sexual Activity  ?Alcohol Use Yes  ? Alcohol/week: 21.0 standard drinks  ? Types: 21 Shots of liquor per week  ?   ?Social History  ? ?Substance and Sexual Activity  ?Drug Use No  ?  ?Social History  ? ?Socioeconomic History  ? Marital status: Married  ?  Spouse name: Not on file  ? Number of children: Not on file  ? Years of education: Not on file  ? Highest education level: Not on file  ?Occupational History  ? Not on file  ?Tobacco Use  ?  Smoking status: Former  ?  Types: Cigarettes  ? Smokeless tobacco: Never  ?Vaping Use  ? Vaping Use: Never used  ?Substance and Sexual Activity  ? Alcohol use: Yes  ?  Alcohol/week: 21.0 standard drinks  ?  Types: 21 Shots of liquor per week  ? Drug use: No  ? Sexual activity: Not on file  ?Other Topics Concern  ? Not on file  ?Social History Narrative  ? Not on file  ? ?Social Determinants of Health  ? ?Financial Resource Strain: Not on file  ?Food Insecurity: Not on file  ?Transportation Needs: Not on file  ?Physical Activity: Not on file  ?Stress: Not on file  ?Social Connections: Not on file  ? ? ?Hospital Course: Did not show any signs of alcohol withdrawal.  No tremors no DTs and no seizures.  Affect and mood have been appropriate.  He consistently denies suicidal ideation.  Patient has been cooperative and pleasant.  Lucid.  The patient shows good insight into his condition and is agreeable to outpatient treatment in the community at discharge.  Plan is to discontinue IVC as he no longer meets commitment criteria and refer him to our HA.  Psychoeducation provided ? ?Physical Findings: ?AIMS:  , ,  ,  ,    ?CIWA:    ?COWS:    ? ?Musculoskeletal: ?Strength & Muscle Tone: within normal limits ?Gait & Station: normal ?Patient leans: N/A ? ? ?Psychiatric Specialty Exam: ? ?Presentation  ?  General Appearance: No data recorded ?Eye Contact:No data recorded ?Speech:No data recorded ?Speech Volume:No data recorded ?Handedness:No data recorded ? ?Mood and Affect  ?Mood:No data recorded ?Affect:No data recorded ? ?Thought Process  ?Thought Processes:No data recorded ?Descriptions of Associations:No data recorded ?Orientation:No data recorded ?Thought Content:No data recorded ?History of Schizophrenia/Schizoaffective disorder:No ? ?Duration of Psychotic Symptoms:No data recorded ?Hallucinations:No data recorded ?Ideas of Reference:No data recorded ?Suicidal Thoughts:No data recorded ?Homicidal Thoughts:No data  recorded ? ?Sensorium  ?Memory:No data recorded ?Judgment:No data recorded ?Insight:No data recorded ? ?Executive Functions  ?Concentration:No data recorded ?Attention Span:No data recorded ?Recall:No data recorded ?Fund of [place] recorded ?Language:No data recorded ? ?Psychomotor Activity  ?Psychomotor Activity:No data recorded ? ?Assets  ?Assets:No data recorded ? ?Sleep  ?Sleep:No data recorded ? ? ?Physical Exam: ?Physical Exam ?Vitals and nursing note reviewed.  ?Constitutional:   ?   Appearance: Normal appearance.  ?HENT:  ?   Head: Normocephalic and atraumatic.  ?   Mouth/Throat:  ?   Pharynx: Oropharynx is clear.  ?Eyes:  ?   Pupils: Pupils are equal, round, and reactive to light.  ?Cardiovascular:  ?   Rate and Rhythm: Normal rate and regular rhythm.  ?Pulmonary:  ?   Effort: Pulmonary effort is normal.  ?   Breath sounds: Normal breath sounds.  ?Abdominal:  ?   General: Abdomen is flat.  ?   Palpations: Abdomen is soft.  ?Musculoskeletal:     ?   General: Normal range of motion.  ?Skin: ?   General: Skin is warm and dry.  ?Neurological:  ?   General: No focal deficit present.  ?   Mental Status: He is alert. Mental status is at baseline.  ?Psychiatric:     ?   Attention and Perception: Attention normal.     ?   Mood and Affect: Mood normal.     ?   Speech: Speech normal.     ?   Behavior: Behavior normal.     ?   Thought Content: Thought content normal.     ?   Cognition and Memory: Cognition normal.     ?   Judgment: Judgment normal.  ? ?Review of Systems  ?Constitutional: Negative.   ?HENT: Negative.    ?Eyes: Negative.   ?Respiratory: Negative.    ?Cardiovascular: Negative.   ?Gastrointestinal: Negative.   ?Musculoskeletal: Negative.   ?Skin: Negative.   ?Neurological: Negative.   ?Psychiatric/Behavioral:  Positive for substance abuse. Negative for depression, hallucinations and suicidal ideas. The patient is not nervous/anxious and does not have insomnia.   ?Blood pressure 126/85, pulse (!)  113, temperature 97.7 ?F (36.5 ?C), temperature source Oral, resp. rate 17, height 5\' 3"  (1.6 m), weight 62.1 kg, SpO2 98 %. Body mass index is 24.27 kg/m?. ? ? ?Social History  ? ?Tobacco Use  ?Smoking Status Former  ? Types: Cigarettes  ?Smokeless Tobacco Never  ? ?Tobacco Cessation:  N/A, patient does not currently use tobacco products ? ? ?Blood Alcohol level:  ?Lab Results  ?Component Value Date  ? ETH 118 (H) 10/11/2021  ? ETH 155 (H) 03/28/2021  ? ? ?Metabolic Disorder Labs:  ?No results found for: HGBA1C, MPG ?No results found for: PROLACTIN ?No results found for: CHOL, TRIG, HDL, CHOLHDL, VLDL, LDLCALC ? ?See Psychiatric Specialty Exam and Suicide Risk Assessment completed by Attending Physician prior to discharge. ? ?Discharge destination:  Home ? ?Is patient on multiple antipsychotic therapies at discharge:  No   ?Has Patient had three  or more failed trials of antipsychotic monotherapy by history:  No ? ?Recommended Plan for Multiple Antipsychotic Therapies: ?NA ? ?Discharge Instructions   ? ? Diet - low sodium heart healthy   Complete by: As directed ?  ? Increase activity slowly   Complete by: As directed ?  ? ?  ? ?Allergies as of 10/13/2021   ? ?   Reactions  ? Amoxicillin Itching  ? Clarithromycin Itching  ? Cortisone Other (See Comments)  ? hiccups  ? ?  ? ?  ?Medication List  ?  ? ?STOP taking these medications   ? ?cyclobenzaprine 7.5 MG tablet ?Commonly known as: FEXMID ?  ? ?  ? ? Follow-up Information   ? ? [facility] Follow up.   ?Why: your appointment is 10/16/2021 @ 11am  ?walk - in M-F 8am - 5pm, state that since you're a recent discharge and you will be priority once you get there. ?Contact information: ?[address] ?[place] [address] ?[phone] ? ? ?  ?  ? ?  ?  ? ?  ? ? ?Follow-up recommendations: Follow-up with local mental health services ? ?Comments: No prescriptions required ? ?Signed: ?Alethia Berthold, MD ?10/13/2021, 11:15 AM ? ? ? ? ? ? ? ?

## 2021-10-13 NOTE — Plan of Care (Signed)
D- Patient alert and oriented. Patient presents in a pleasant mood on assessment stating that he slept good last night and had no complaints to voice to this Clinical research associate. Patient denies SI, HI, AVH, and pain at this time. Patient also denies any signs/symptoms of depression/anxiety stating that overall, he is feeling "pretty good". Patient's goal for today is to "see my wife and kids", in which he will "behave", in order to achieve his goal. ? ?A- Support and encouragement provided.  Routine safety checks conducted every 15 minutes. Patient informed to notify staff with problems or concerns. ? ?R- Patient contracts for safety at this time. Patient compliant with treatment plan. Patient receptive, calm, and cooperative. Patient interacts well with others on the unit. Patient remains safe at this time. ? ?Problem: Education: ?Goal: Knowledge of [facility] Education information/materials will improve ?Outcome: Progressing ?Goal: Emotional status will improve ?Outcome: Progressing ?Goal: Mental status will improve ?Outcome: Progressing ?Goal: Verbalization of understanding the information provided will improve ?Outcome: Progressing ?  ?Problem: Health Behavior/Discharge Planning: ?Goal: Identification of resources available to assist in meeting health care needs will improve ?Outcome: Progressing ?Goal: Compliance with treatment plan for underlying cause of condition will improve ?Outcome: Progressing ?  ?Problem: Education: ?Goal: Utilization of techniques to improve thought processes will improve ?Outcome: Progressing ?Goal: Knowledge of the prescribed therapeutic regimen will improve ?Outcome: Progressing ?  ?Problem: Activity: ?Goal: Interest or engagement in leisure activities will improve ?Outcome: Progressing ?Goal: Imbalance in normal sleep/wake cycle will improve ?Outcome: Progressing ?  ?Problem: Coping: ?Goal: Coping ability will improve ?Outcome: Progressing ?Goal: Will verbalize feelings ?Outcome:  Progressing ?  ?Problem: Safety: ?Goal: Ability to disclose and discuss suicidal ideas will improve ?Outcome: Progressing ?Goal: Ability to identify and utilize support systems that promote safety will improve ?Outcome: Progressing ?  ?Problem: Self-Concept: ?Goal: Will verbalize positive feelings about self ?Outcome: Progressing ?Goal: Level of anxiety will decrease ?Outcome: Progressing ?  ?

## 2021-10-13 NOTE — BHH Suicide Risk Assessment (Signed)
[facility] Admission Suicide Risk Assessment ? ? ?Nursing information obtained from:  Patient ?Demographic factors:  Male, Living alone ?Current Mental Status:  NA ?Loss Factors:    ?Historical Factors:  NA ?Risk Reduction Factors:  Responsible for children under 28 years of age ? ?Total Time spent with patient: 1 hour ?Principal Problem: Adjustment disorder with mixed disturbance of emotions and conduct ?Diagnosis:  Principal Problem: ?  Adjustment disorder with mixed disturbance of emotions and conduct ?Active Problems: ?  Alcohol abuse ? ?Subjective Data: Patient seen and chart reviewed.  26 year old man brought to the emergency room under IVC after making suicidal statements at home in front of his wife while he was intoxicated.  Patient did have a gun out at home but did not make an attempt to shoot himself.  He has denied suicidal ideations since being sober.  Patient is currently calm and appropriate lucid and forthcoming with good insight.  Has multiple positive plans for the future. ? ?Continued Clinical Symptoms:  ?Alcohol Use Disorder Identification Test Final Score (AUDIT): 4 ?The "Alcohol Use Disorders Identification Test", Guidelines for Use in Primary Care, Second Edition.  World Science writer [facility]). ?Score between 0-7:  no or low risk or alcohol related problems. ?Score between 8-15:  moderate risk of alcohol related problems. ?Score between 16-19:  high risk of alcohol related problems. ?Score 20 or above:  warrants further diagnostic evaluation for alcohol dependence and treatment. ? ? ?CLINICAL FACTORS:  ? Depression:   Comorbid alcohol abuse/dependence ?Alcohol/Substance Abuse/Dependencies ? ? ?Musculoskeletal: ?Strength & Muscle Tone: within normal limits ?Gait & Station: normal ?Patient leans: N/A ? ?Psychiatric Specialty Exam: ? ?Presentation  ?General Appearance: No data recorded ?Eye Contact:No data recorded ?Speech:No data recorded ?Speech Volume:No data recorded ?Handedness:No data  recorded ? ?Mood and Affect  ?Mood:No data recorded ?Affect:No data recorded ? ?Thought Process  ?Thought Processes:No data recorded ?Descriptions of Associations:No data recorded ?Orientation:No data recorded ?Thought Content:No data recorded ?History of Schizophrenia/Schizoaffective disorder:No ? ?Duration of Psychotic Symptoms:No data recorded ?Hallucinations:No data recorded ?Ideas of Reference:No data recorded ?Suicidal Thoughts:No data recorded ?Homicidal Thoughts:No data recorded ? ?Sensorium  ?Memory:No data recorded ?Judgment:No data recorded ?Insight:No data recorded ? ?Executive Functions  ?Concentration:No data recorded ?Attention Span:No data recorded ?Recall:No data recorded ?Fund of Knowledge:No data recorded ?Language:No data recorded ? ?Psychomotor Activity  ?Psychomotor Activity:No data recorded ? ?Assets  ?Assets:No data recorded ? ?Sleep  ?Sleep:No data recorded ? ? ?Physical Exam: ?Physical Exam ?Vitals and nursing note reviewed.  ?Constitutional:   ?   Appearance: Normal appearance.  ?HENT:  ?   Head: Normocephalic and atraumatic.  ?   Mouth/Throat:  ?   Pharynx: Oropharynx is clear.  ?Eyes:  ?   Pupils: Pupils are equal, round, and reactive to light.  ?Cardiovascular:  ?   Rate and Rhythm: Normal rate and regular rhythm.  ?Pulmonary:  ?   Effort: Pulmonary effort is normal.  ?   Breath sounds: Normal breath sounds.  ?Abdominal:  ?   General: Abdomen is flat.  ?   Palpations: Abdomen is soft.  ?Musculoskeletal:     ?   General: Normal range of motion.  ?Skin: ?   General: Skin is warm and dry.  ?Neurological:  ?   General: No focal deficit present.  ?   Mental Status: He is alert. Mental status is at baseline.  ?Psychiatric:     ?   Attention and Perception: Attention normal.     ?   Mood and Affect:  Mood normal.     ?   Speech: Speech normal.     ?   Behavior: Behavior normal.     ?   Thought Content: Thought content normal.     ?   Cognition and Memory: Cognition normal.     ?   Judgment:  Judgment normal.  ? ?Review of Systems  ?Constitutional: Negative.   ?HENT: Negative.    ?Eyes: Negative.   ?Respiratory: Negative.    ?Cardiovascular: Negative.   ?Gastrointestinal: Negative.   ?Musculoskeletal: Negative.   ?Skin: Negative.   ?Neurological: Negative.   ?Psychiatric/Behavioral:  Positive for substance abuse. Negative for depression, hallucinations, memory loss and suicidal ideas. The patient is not nervous/anxious and does not have insomnia.   ?Blood pressure 126/85, pulse (!) 113, temperature 97.7 ?F (36.5 ?C), temperature source Oral, resp. rate 17, height 5\' 3"  (1.6 m), weight 62.1 kg, SpO2 98 %. Body mass index is 24.27 kg/m?. ? ? ?COGNITIVE FEATURES THAT CONTRIBUTE TO RISK:  ?None   ? ?SUICIDE RISK:  ? Minimal: No identifiable suicidal ideation.  Patients presenting with no risk factors but with morbid ruminations; may be classified as minimal risk based on the severity of the depressive symptoms ? ?PLAN OF CARE: Patient has been evaluated both in the emergency room and the inpatient unit.  No indication for need for detox at this point and no indication to necessarily start any medicine for depression.  Patient is going to be discharged home with referral to outpatient treatment at our HA ? ?I certify that inpatient services furnished can reasonably be expected to improve the patient's condition.  ? ? , MD ?10/13/2021, 10:57 AM ? ?

## 2021-10-13 NOTE — H&P (Signed)
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  26 year old man was brought to the emergency room after an episode in which he made comments in front of his family about possibly killing himself.  He was intoxicated and had his gun out next to him on the sofa at the time.  Patient acknowledges this and that he had touched the gun but says that he did not tried to shoot himself and that upon actually touching the firearm he no longer had any thought about wanting to die.  Patient says he has been under a lot of stress recently.  The main source of his stress is conflict within his family.  His parents according to him stay involved meddling in his home life trying to tell him and his wife how to raise their kids and how to live their life.  This is causing a lot of conflict between him and his wife.  Patient is working regularly.  Job can be stressful at times.  He acknowledges that he has had problems with drinking in the past.  He stayed sober for 2 months but then drank a lot of beer on the day of the episode in question.  Denies using any other drugs.  Patient not currently seeing anyone for mental health treatment.  On evaluation today the patient's behavior and affect are euthymic calm and appropriate with good insight.  He acknowledges that alcohol has been a problem for him and he is better off staying away from it.  Patient completely denies suicidal thoughts and is able to describe multiple positive things in his family and positive plans  for the future. Associated Signs/Symptoms: Depression Symptoms:  suicidal thoughts with specific plan, anxiety, Duration of Depression Symptoms: Less than two weeks  (Hypo) Manic Symptoms:   Denies any Anxiety Symptoms:  Excessive Worry, Psychotic Symptoms:   Denies any PTSD Symptoms: Negative Total Time spent with patient: 1 hour  Past Psychiatric History: No past suicide attempts.  No past hospitalization.  No past medication.  He used to work at [employer] and while he was there saw a Veterinary surgeon for therapy but has not been seeing or talking with anyone since leaving that job.  Has not engaged in any active substance abuse treatment.  No history of any self-harm previously.  Is the patient at risk to self? No.  Has the patient been a risk to self in the past 6 months? Yes.    Has the patient been a risk to self within the distant past? No.  Is the patient a risk to others? No.  Has the patient been a risk to others in the past 6 months? No.  Has the patient been a risk to others within the distant past? No.   Prior Inpatient Therapy:   Prior Outpatient Therapy:    Alcohol Screening: 1. How often do you have a drink containing alcohol?: 2 to 4 times a month 2. How many drinks containing alcohol do you have on a typical day when you are drinking?: 5 or 6 3. How often do you have six or more drinks on  one occasion?: Never AUDIT-C Score: 4 4. How often during the last year have you found that you were not able to stop drinking once you had started?: Never 5. How often during the last year have you failed to do what was normally expected from you because of drinking?: Never 6. How often during the last year have you needed a first drink in the morning to get yourself going after a heavy drinking session?: Never 7. How often during the last year have you had a feeling of guilt of remorse after drinking?: Never 8. How often during the last year have you been unable to  remember what happened the night before because you had been drinking?: Never 9. Have you or someone else been injured as a result of your drinking?: No 10. Has a relative or friend or a doctor or another health worker been concerned about your drinking or suggested you cut down?: No Alcohol Use Disorder Identification Test Final Score (AUDIT): 4 Substance Abuse History in the last 12 months:  Yes.   Consequences of Substance Abuse: It is evident that alcohol use clouds his judgment and affects his moodiness Previous Psychotropic Medications: No  Psychological Evaluations: Yes  Past Medical History:  Past Medical History:  Diagnosis Date   Heart murmur    Wolff-Parkinson-White syndrome     Past Surgical History:  Procedure Laterality Date   ELECTROPHYSIOLOGIC STUDY     08/2018   HAND SURGERY Right    5th finger, 2012 ad 2013   TONSILLECTOMY     Family History: History reviewed. No pertinent family history. Family Psychiatric  History: Patient reports multiple family members with alcohol abuse problems.  No history of suicide in the family Tobacco Screening:   Social History:  Social History   Substance and Sexual Activity  Alcohol Use Yes   Alcohol/week: 21.0 standard drinks   Types: 21 Shots of liquor per week     Social History   Substance and Sexual Activity  Drug Use No    Additional Social History: Marital status: Married Number of Years Married: 9 What types of issues is patient dealing with in the relationship?: Patient stated "parents keep getting involved" Does patient have children?: Yes How many children?: 2 How is patient's relationship with their children?: Patient stated "good, normal"                         Allergies:   Allergies  Allergen Reactions   Amoxicillin Itching   Clarithromycin Itching   Cortisone Other (See Comments)    hiccups   Lab Results:  Results for orders placed or performed during the hospital encounter of 10/12/21  (from the past 48 hour(s))  Urine Drug Screen, Qualitative     Status: None   Collection Time: 10/11/21 11:18 PM  Result Value Ref Range   Tricyclic, Ur Screen NONE DETECTED NONE DETECTED   Amphetamines, Ur Screen NONE DETECTED NONE DETECTED   MDMA (Ecstasy)Ur Screen NONE DETECTED NONE DETECTED   Cocaine Metabolite,Ur [place] NONE DETECTED NONE DETECTED   Opiate, Ur Screen NONE DETECTED NONE DETECTED   Phencyclidine (PCP) Ur S NONE DETECTED NONE DETECTED   Cannabinoid 50 Ng, Ur [place] NONE DETECTED NONE DETECTED   Barbiturates, Ur Screen NONE DETECTED NONE DETECTED   Benzodiazepine, Ur Scrn NONE DETECTED NONE DETECTED   Methadone Scn, Ur NONE DETECTED NONE DETECTED    Comment: (NOTE) Tricyclics + metabolites, urine    Cutoff 1000 ng/mL Amphetamines +  metabolites, urine  Cutoff 1000 ng/mL MDMA (Ecstasy), urine              Cutoff 500 ng/mL Cocaine Metabolite, urine          Cutoff 300 ng/mL Opiate + metabolites, urine        Cutoff 300 ng/mL Phencyclidine (PCP), urine         Cutoff 25 ng/mL Cannabinoid, urine                 Cutoff 50 ng/mL Barbiturates + metabolites, urine  Cutoff 200 ng/mL Benzodiazepine, urine              Cutoff 200 ng/mL Methadone, urine                   Cutoff 300 ng/mL  The urine drug screen provides only a preliminary, unconfirmed analytical test result and should not be used for non-medical purposes. Clinical consideration and professional judgment should be applied to any positive drug screen result due to possible interfering substances. A more specific alternate chemical method must be used in order to obtain a confirmed analytical result. Gas chromatography / mass spectrometry (GC/MS) is the preferred confirm atory method. Performed at [facility], [address] [place], [place] [address]   Comprehensive metabolic panel     Status: Abnormal   Collection Time: 10/11/21 11:30 PM  Result Value Ref Range   Sodium 141 135 - 145 mmol/L   Potassium  3.4 (L) 3.5 - 5.1 mmol/L   Chloride 108 98 - 111 mmol/L   CO2 23 22 - 32 mmol/L   Glucose, Bld 85 70 - 99 mg/dL    Comment: Glucose reference range applies only to samples taken after fasting for at least 8 hours.   BUN 11 6 - 20 mg/dL   Creatinine, Ser 8.46 0.61 - 1.24 mg/dL   Calcium 9.5 8.9 - 96.2 mg/dL   Total Protein 8.2 (H) 6.5 - 8.1 g/dL   Albumin 4.8 3.5 - 5.0 g/dL   AST 21 15 - 41 U/L   ALT 18 0 - 44 U/L   Alkaline Phosphatase 86 38 - 126 U/L   Total Bilirubin 0.5 0.3 - 1.2 mg/dL   GFR, Estimated >95 >28 mL/min    Comment: (NOTE) Calculated using the CKD-EPI Creatinine Equation (2021)    Anion gap 10 5 - 15    Comment: Performed at [facility], [address] [place], [place] [address]  Ethanol     Status: Abnormal   Collection Time: 10/11/21 11:30 PM  Result Value Ref Range   Alcohol, Ethyl (B) 118 (H) <10 mg/dL    Comment: (NOTE) Lowest detectable limit for serum alcohol is 10 mg/dL.  For medical purposes only. Performed at [facility], [address] [place], [place] [address]   Salicylate level     Status: Abnormal   Collection Time: 10/11/21 11:30 PM  Result Value Ref Range   Salicylate Lvl <7.0 (L) 7.0 - 30.0 mg/dL    Comment: Performed at [facility], [address] [place], [place] [address]  Acetaminophen level     Status: Abnormal   Collection Time: 10/11/21 11:30 PM  Result Value Ref Range   Acetaminophen (Tylenol), Serum <10 (L) 10 - 30 ug/mL    Comment: (NOTE) Therapeutic concentrations vary significantly. A range of 10-30 ug/mL  may be an effective concentration for many patients. However, some  are best treated at concentrations outside of this range.  Acetaminophen concentrations >150 ug/mL at 4 hours after ingestion  and >50 ug/mL at 12 hours after ingestion are often associated with  toxic reactions.  Performed at [facility], [address] [place], [place] [address]   cbc     Status: None    Collection Time: 10/11/21 11:30 PM  Result Value Ref Range   WBC 9.5 4.0 - 10.5 K/uL   RBC 5.07 4.22 - 5.81 MIL/uL   Hemoglobin 14.4 13.0 - 17.0 g/dL   HCT 60.4 54.0 - 98.1 %   MCV 86.2 80.0 - 100.0 fL   MCH 28.4 26.0 - 34.0 pg   MCHC 33.0 30.0 - 36.0 g/dL   RDW 19.1 47.8 - 29.5 %   Platelets 362 150 - 400 K/uL   nRBC 0.0 0.0 - 0.2 %    Comment: Performed at [facility], [address], [place], [place] [address]  Resp Panel by RT-PCR (Flu A&B, Covid) Nasopharyngeal Swab     Status: None   Collection Time: 10/12/21  3:36 AM   Specimen: Nasopharyngeal Swab; Nasopharyngeal(NP) swabs in vial transport medium  Result Value Ref Range   SARS Coronavirus 2 by RT PCR NEGATIVE NEGATIVE    Comment: (NOTE) SARS-CoV-2 target nucleic acids are NOT DETECTED.  The SARS-CoV-2 RNA is generally detectable in upper respiratory specimens during the acute phase of infection. The lowest concentration of SARS-CoV-2 viral copies this assay can detect is 138 copies/mL. A negative result does not preclude SARS-Cov-2 infection and should not be used as the sole basis for treatment or other patient management decisions. A negative result may occur with  improper specimen collection/handling, submission of specimen other than nasopharyngeal swab, presence of viral mutation(s) within the areas targeted by this assay, and inadequate number of viral copies(<138 copies/mL). A negative result must be combined with clinical observations, patient history, and epidemiological information. The expected result is Negative.  Fact Sheet for Patients:  BloggerCourse.com  Fact Sheet for Healthcare Providers:  SeriousBroker.it  This test is no t yet approved or cleared by the [place] FDA and  has been authorized for detection and/or diagnosis of SARS-CoV-2 by FDA under an Emergency Use Authorization (EUA). This EUA will remain  in effect (meaning this  test can be used) for the duration of the COVID-19 declaration under Section 564(b)(1) of the Act, 21 U.S.C.section 360bbb-3(b)(1), unless the authorization is terminated  or revoked sooner.       Influenza A by PCR NEGATIVE NEGATIVE   Influenza B by PCR NEGATIVE NEGATIVE    Comment: (NOTE) The Xpert Xpress SARS-CoV-2/FLU/RSV plus assay is intended as an aid in the diagnosis of influenza from Nasopharyngeal swab specimens and should not be used as a sole basis for treatment. Nasal washings and aspirates are unacceptable for Xpert Xpress SARS-CoV-2/FLU/RSV testing.  Fact Sheet for Patients: BloggerCourse.com  Fact Sheet for Healthcare Providers: SeriousBroker.it  This test is not yet approved or cleared by the [place] FDA and has been authorized for detection and/or diagnosis of SARS-CoV-2 by FDA under an Emergency Use Authorization (EUA). This EUA will remain in effect (meaning this test can be used) for the duration of the COVID-19 declaration under Section 564(b)(1) of the Act, 21 U.S.C. section 360bbb-3(b)(1), unless the authorization is terminated or revoked.  Performed at [facility], [address] [place], [place] [address]     Blood Alcohol level:  Lab Results  Component Value Date   ETH 118 (H) 10/11/2021   ETH 155 (  H) 03/28/2021    Metabolic Disorder Labs:  No results found for: HGBA1C, MPG No results found for: PROLACTIN No results found for: CHOL, TRIG, HDL, CHOLHDL, VLDL, LDLCALC  Current Medications: Current Facility-Administered Medications  Medication Dose Route Frequency Provider Last Rate Last Admin   acetaminophen (TYLENOL) tablet 650 mg  650 mg Oral Q6H PRN Charm Rings, NP       alum & mag hydroxide-simeth (MAALOX/MYLANTA) 200-200-20 MG/5ML suspension 30 mL  30 mL Oral Q4H PRN Charm Rings, NP       cyclobenzaprine (FLEXERIL) tablet 7.5 mg  7.5 mg Oral BID PRN Charm Rings, NP       hydrOXYzine (ATARAX) tablet 25 mg  25 mg Oral Q6H PRN Charm Rings, NP       loperamide (IMODIUM) capsule 2-4 mg  2-4 mg Oral PRN Charm Rings, NP       LORazepam (ATIVAN) tablet 1 mg  1 mg Oral Q6H PRN Charm Rings, NP       LORazepam (ATIVAN) tablet 1 mg  1 mg Oral QID Charm Rings, NP   1 mg at 10/12/21 2118   Followed by   LORazepam (ATIVAN) tablet 1 mg  1 mg Oral TID Charm Rings, NP       Followed by   Melene Muller ON 10/14/2021] LORazepam (ATIVAN) tablet 1 mg  1 mg Oral BID Charm Rings, NP       Followed by   Melene Muller ON 10/16/2021] LORazepam (ATIVAN) tablet 1 mg  1 mg Oral Daily Lord, Jamison Y, NP       magnesium hydroxide (MILK OF MAGNESIA) suspension 30 mL  30 mL Oral Daily PRN Charm Rings, NP       multivitamin with minerals tablet 1 tablet  1 tablet Oral Daily Lord, Jamison Y, NP       ondansetron (ZOFRAN-ODT) disintegrating tablet 4 mg  4 mg Oral Q6H PRN Charm Rings, NP       thiamine tablet 100 mg  100 mg Oral Daily Charm Rings, NP       PTA Medications: Medications Prior to Admission  Medication Sig Dispense Refill Last Dose   cyclobenzaprine (FEXMID) 7.5 MG tablet Take 1 tablet (7.5 mg total) by mouth 2 (two) times daily as needed for muscle spasms. 10 tablet 0     Musculoskeletal: Strength & Muscle Tone: within normal limits Gait & Station: normal Patient leans: N/A            Psychiatric Specialty Exam:  Presentation  General Appearance: No data recorded Eye Contact:No data recorded Speech:No data recorded Speech Volume:No data recorded Handedness:No data recorded  Mood and Affect  Mood:No data recorded Affect:No data recorded  Thought Process  Thought Processes:No data recorded Duration of Psychotic Symptoms: No data recorded Past Diagnosis of Schizophrenia or Psychoactive disorder: No  Descriptions of Associations:No data recorded Orientation:No data recorded Thought Content:No data  recorded Hallucinations:No data recorded Ideas of Reference:No data recorded Suicidal Thoughts:No data recorded Homicidal Thoughts:No data recorded  Sensorium  Memory:No data recorded Judgment:No data recorded Insight:No data recorded  Executive Functions  Concentration:No data recorded Attention Span:No data recorded Recall:No data recorded Fund of Knowledge:No data recorded Language:No data recorded  Psychomotor Activity  Psychomotor Activity:No data recorded  Assets  Assets:No data recorded  Sleep  Sleep:No data recorded   Physical Exam: Physical Exam Constitutional:      Appearance: Normal appearance.  HENT:  Head: Normocephalic and atraumatic.     Mouth/Throat:     Pharynx: Oropharynx is clear.  Eyes:     Pupils: Pupils are equal, round, and reactive to light.  Cardiovascular:     Rate and Rhythm: Normal rate and regular rhythm.  Pulmonary:     Effort: Pulmonary effort is normal.     Breath sounds: Normal breath sounds.  Abdominal:     General: Abdomen is flat.     Palpations: Abdomen is soft.  Musculoskeletal:        General: Normal range of motion.  Skin:    General: Skin is warm and dry.  Neurological:     General: No focal deficit present.     Mental Status: He is alert. Mental status is at baseline.  Psychiatric:        Mood and Affect: Mood normal.        Speech: Speech normal.        Behavior: Behavior normal.        Thought Content: Thought content normal.        Cognition and Memory: Cognition normal.        Judgment: Judgment normal.   ROS Blood pressure 126/85, pulse (!) 113, temperature 97.7 F (36.5 C), temperature source Oral, resp. rate 17, height 5\' 3"  (1.6 m), weight 62.1 kg, SpO2 98 %. Body mass index is 24.27 kg/m.  Treatment Plan Summary: Plan 26 year old man brought here under IVC because of suicidal statement while intoxicated.  Patient is now sober and has consistently denied suicidal ideations since his evaluation here  at the hospital.  He is not showing any signs of active alcohol withdrawal.  No history of DTs or seizures in the past.  Currently his behavior is appropriate and calm and lucid.  Patient is open and agreeable to outpatient treatment.  There is no specific indication for any medicine in the hospital or clear indication to start psychiatric medicine at discharge.  Patient has been advised about the availability of services at our [facility] and referral will be made and he is strongly encouraged to follow up with walk-in appointment at our [facility] for treatment of both mood symptoms and alcohol abuse.  Supportive counseling therapy and psychiatric psychoeducation completed.  Patient no longer meets commitment criteria.  Just continue IVC and he can be discharged home.  The gun has by his report already been removed from the house.  Observation Level/Precautions:  15 minute checks  Laboratory:  UDS  Psychotherapy:    Medications:    Consultations:    Discharge Concerns:    Estimated LOS:  Other:     Physician Treatment Plan for Primary Diagnosis: Adjustment disorder with mixed disturbance of emotions and conduct Long Term Goal(s): Improvement in symptoms so as ready for discharge  Short Term Goals: Ability to verbalize feelings will improve and Ability to maintain clinical measurements within normal limits will improve  Physician Treatment Plan for Secondary Diagnosis: Principal Problem:   Adjustment disorder with mixed disturbance of emotions and conduct Active Problems:   Alcohol abuse  Long Term Goal(s): Improvement in symptoms so as ready for discharge  Short Term Goals: Ability to maintain clinical measurements within normal limits will improve  I certify that inpatient services furnished can reasonably be expected to improve the patient's condition.    Mordecai Rasmussen, MD 3/28/202311:02 AM

## 2021-10-13 NOTE — [facility] Suicide Risk Assessment (Signed)
[facility] Discharge Suicide Risk Assessment ? ? ?Principal Problem: Adjustment disorder with mixed disturbance of emotions and conduct ?Discharge Diagnoses: Principal Problem: ?  Adjustment disorder with mixed disturbance of emotions and conduct ?Active Problems: ?  Alcohol abuse ? ? ?Total Time spent with patient: 1 hour ? ?Musculoskeletal: ?Strength & Muscle Tone: within normal limits ?Gait & Station: normal ?Patient leans: N/A ? ?Psychiatric Specialty Exam ? ?Presentation  ?General Appearance: No data recorded ?Eye Contact:No data recorded ?Speech:No data recorded ?Speech Volume:No data recorded ?Handedness:No data recorded ? ?Mood and Affect  ?Mood:No data recorded ?Duration of Depression Symptoms: Less than two weeks ? ?Affect:No data recorded ? ?Thought Process  ?Thought Processes:No data recorded ?Descriptions of Associations:No data recorded ?Orientation:No data recorded ?Thought Content:No data recorded ?History of Schizophrenia/Schizoaffective disorder:No ? ?Duration of Psychotic Symptoms:No data recorded ?Hallucinations:No data recorded ?Ideas of Reference:No data recorded ?Suicidal Thoughts:No data recorded ?Homicidal Thoughts:No data recorded ? ?Sensorium  ?Memory:No data recorded ?Judgment:No data recorded ?Insight:No data recorded ? ?Executive Functions  ?Concentration:No data recorded ?Attention Span:No data recorded ?Recall:No data recorded ?Fund of Knowledge:No data recorded ?Language:No data recorded ? ?Psychomotor Activity  ?Psychomotor Activity:No data recorded ? ?Assets  ?Assets:No data recorded ? ?Sleep  ?Sleep:No data recorded ? ?Physical Exam: ?Physical Exam ?Vitals and nursing note reviewed.  ?Constitutional:   ?   Appearance: Normal appearance.  ?HENT:  ?   Head: Normocephalic and atraumatic.  ?   Mouth/Throat:  ?   Pharynx: Oropharynx is clear.  ?Eyes:  ?   Pupils: Pupils are equal, round, and reactive to light.  ?Cardiovascular:  ?   Rate and Rhythm: Normal rate and regular rhythm.   ?Pulmonary:  ?   Effort: Pulmonary effort is normal.  ?   Breath sounds: Normal breath sounds.  ?Abdominal:  ?   General: Abdomen is flat.  ?   Palpations: Abdomen is soft.  ?Musculoskeletal:     ?   General: Normal range of motion.  ?Skin: ?   General: Skin is warm and dry.  ?Neurological:  ?   General: No focal deficit present.  ?   Mental Status: He is alert. Mental status is at baseline.  ?Psychiatric:     ?   Mood and Affect: Mood normal.     ?   Thought Content: Thought content normal.  ? ?Review of Systems  ?Constitutional: Negative.   ?HENT: Negative.    ?Eyes: Negative.   ?Respiratory: Negative.    ?Cardiovascular: Negative.   ?Gastrointestinal: Negative.   ?Musculoskeletal: Negative.   ?Skin: Negative.   ?Neurological: Negative.   ?Psychiatric/Behavioral:  Positive for substance abuse. Negative for depression, hallucinations, memory loss and suicidal ideas. The patient is not nervous/anxious and does not have insomnia.   ?Blood pressure 126/85, pulse (!) 113, temperature 97.7 ?F (36.5 ?C), temperature source Oral, resp. rate 17, height 5\' 3"  (1.6 m), weight 62.1 kg, SpO2 98 %. Body mass index is 24.27 kg/m?. ? ?Mental Status Per Nursing Assessment::   ?On Admission:  NA ? ?Demographic Factors:  ?Male and Access to firearms ? ?Loss Factors: ?Interpersonal stresses with family ? ?Historical Factors: ?Family history of mental illness or substance abuse ? ?Risk Reduction Factors:   ?Responsible for children under 36 years of age, Sense of responsibility to family, Living with another person, especially a relative, Positive social support, and Positive coping skills or problem solving skills ? ?Continued Clinical Symptoms:  ?Depression:   Comorbid alcohol abuse/dependence ?Alcohol/Substance Abuse/Dependencies ? ?Cognitive Features That Contribute To Risk:  ?  None   ? ?Suicide Risk:  ?Minimal: No identifiable suicidal ideation.  Patients presenting with no risk factors but with morbid ruminations; may be  classified as minimal risk based on the severity of the depressive symptoms ? ? ? ?Plan Of Care/Follow-up recommendations:  ?Patient denies any suicidal ideation.  Behavior is calm and appropriate and thoughts appear to be insightful and appropriate.  No longer meets commitment criteria.  Patient agreeable to recommended outpatient treatment follow-up.  He will be discharged today with follow-up referred to our [facility] ? ?Mordecai Rasmussen, MD ?10/13/2021, 10:59 AM ?

## 2021-10-13 NOTE — Progress Notes (Signed)
Patient refused his morning medication stating to this writer that "I'm ok". MD will be notified. ?
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
## 2021-10-13 NOTE — Progress Notes (Signed)
Patient also refused his afternoon scheduled Ativan. MD made aware. ?

## 2021-10-13 NOTE — Group Note (Cosign Needed)
[facility] LCSW Group Therapy Note ? ? ?Group Date: 10/13/2021 ?Start Time: 1300 ?End Time: 1400 ? ? ?Type of Therapy/Topic:  Group Therapy:  Balance in Life ? ?Participation Level:  Active  ? ?Description of Group:   ? This group will address the concept of balance and how it feels and looks when one is unbalanced. Patients will be encouraged to process areas in their lives that are out of balance, and identify reasons for remaining unbalanced. Facilitators will guide patients utilizing problem- solving interventions to address and correct the stressor making their life unbalanced. Understanding and applying boundaries will be explored and addressed for obtaining  and maintaining a balanced life. Patients will be encouraged to explore ways to assertively make their unbalanced needs known to significant others in their lives, using other group members and facilitator for support and feedback. ? ?Therapeutic Goals: ?Patient will identify two or more emotions or situations they have that consume much of in their lives. ?Patient will identify signs/triggers that life has become out of balance:  ?Patient will identify two ways to set boundaries in order to achieve balance in their lives:  ?Patient will demonstrate ability to communicate their needs through discussion and/or role plays ? ?Summary of Patient Progress: ?Patient was present for the entirety of the group session. Patient was an active listener and participated in the topic of discussion, provided helpful advice to others, and added nuance to topic of conversation.   ? ? ? ? ? ? ?Therapeutic Modalities:   ?Cognitive Behavioral Therapy ?Solution-Focused Therapy ?Assertiveness Training ? ? ?Shritha Bresee F Khaliq Turay, Student-Social Work ?

## 2021-10-13 NOTE — Progress Notes (Signed)
Recreation Therapy Notes ? ? ?Date: 10/13/2021 ? ?Time: 10:35am  ? ?Location: Courtyard   ? ?Behavioral response: Appropriate ? ?Intervention Topic:  Wellness   ? ?Discussion/Intervention:  ?Group content today was focused on Wellness. The group defined wellness and some positive ways they make decisions for themselves. Individuals expressed reasons why they neglected any wellness in the past. Patients described ways to improve wellness skills in the future. The group explained what could happen if they did not do any wellness at all. Participants express how bad choices has affected them and others around them. Individual explained the importance of wellness. The group participated in the intervention ?Testing my Wellness? where they had a chance to identify some of their weaknesses and strengths in wellness.  ?Clinical Observations/Feedback: ?Patient came to group and was focused on what peers and staff had to say about wellness. Participant expressed that music and being with his family helps him with his overall wellness.  Individual was social with peers and staff while participating in the intervention.    ?Anahli Arvanitis LRT/CTRS  ? ? ? ? ? ? ? ? ?Henry Garcia ?10/13/2021 12:36 PM ?
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
## 2021-10-30 ENCOUNTER — Emergency Department
Admission: EM | Admit: 2021-10-30 | Discharge: 2021-10-30 | Disposition: A | Discharge status: Home / Self Care | Payer: Self-pay | Attending: Emergency Medicine | Admitting: Emergency Medicine

## 2021-10-30 ENCOUNTER — Encounter: Payer: Self-pay | Admitting: Emergency Medicine

## 2021-10-30 ENCOUNTER — Other Ambulatory Visit: Payer: Self-pay

## 2021-10-30 DIAGNOSIS — R5381 Other malaise: Secondary | ICD-10-CM | POA: Insufficient documentation

## 2021-10-30 DIAGNOSIS — Z20822 Contact with and (suspected) exposure to covid-19: Secondary | ICD-10-CM | POA: Insufficient documentation

## 2021-10-30 DIAGNOSIS — J020 Streptococcal pharyngitis: Secondary | ICD-10-CM | POA: Insufficient documentation

## 2021-10-30 LAB — COMPREHENSIVE METABOLIC PANEL
ALT: 18 U/L (ref 0–44)
AST: 22 U/L (ref 15–41)
Albumin: 4.6 g/dL (ref 3.5–5.0)
Alkaline Phosphatase: 79 U/L (ref 38–126)
Anion gap: 8 (ref 5–15)
BUN: 15 mg/dL (ref 6–20)
CO2: 26 mmol/L (ref 22–32)
Calcium: 9.2 mg/dL (ref 8.9–10.3)
Chloride: 103 mmol/L (ref 98–111)
Creatinine, Ser: 0.88 mg/dL (ref 0.61–1.24)
GFR, Estimated: >60 mL/min (ref >60)
Glucose, Bld: 114 mg/dL — ABNORMAL HIGH (ref 70–99)
Potassium: 3.7 mmol/L (ref 3.5–5.1)
Sodium: 137 mmol/L (ref 135–145)
Total Bilirubin: 0.9 mg/dL (ref 0.3–1.2)
Total Protein: 7.8 g/dL (ref 6.5–8.1)

## 2021-10-30 LAB — URINALYSIS, ROUTINE W REFLEX MICROSCOPIC
Bilirubin Urine: NEGATIVE
Glucose, UA: NEGATIVE mg/dL
Hgb urine dipstick: NEGATIVE
Ketones, ur: 20 mg/dL — AB
Leukocytes,Ua: NEGATIVE
Nitrite: NEGATIVE
Protein, ur: NEGATIVE mg/dL
Specific Gravity, Urine: 1.019 (ref 1.005–1.030)
pH: 7 (ref 5.0–8.0)

## 2021-10-30 LAB — CBC WITH DIFFERENTIAL/PLATELET
Abs Immature Granulocytes: 0.06 10*3/uL (ref 0.00–0.07)
Basophils Absolute: 0 10*3/uL (ref 0.0–0.1)
Basophils Relative: 0 %
Eosinophils Absolute: 0 10*3/uL (ref 0.0–0.5)
Eosinophils Relative: 0 %
HCT: 40.4 % (ref 39.0–52.0)
Hemoglobin: 13.5 g/dL (ref 13.0–17.0)
Immature Granulocytes: 1 %
Lymphocytes Relative: 5 %
Lymphs Abs: 0.7 10*3/uL (ref 0.7–4.0)
MCH: 28.4 pg (ref 26.0–34.0)
MCHC: 33.4 g/dL (ref 30.0–36.0)
MCV: 85.1 fL (ref 80.0–100.0)
Monocytes Absolute: 1.2 10*3/uL — ABNORMAL HIGH (ref 0.1–1.0)
Monocytes Relative: 9 %
Neutro Abs: 10.6 10*3/uL — ABNORMAL HIGH (ref 1.7–7.7)
Neutrophils Relative %: 85 %
Platelets: 250 10*3/uL (ref 150–400)
RBC: 4.75 MIL/uL (ref 4.22–5.81)
RDW: 13 % (ref 11.5–15.5)
WBC: 12.5 10*3/uL — ABNORMAL HIGH (ref 4.0–10.5)
nRBC: 0 % (ref 0.0–0.2)

## 2021-10-30 LAB — URINE DRUG SCREEN, QUALITATIVE (ARMC ONLY)
Amphetamines, Ur Screen: NOT DETECTED
Barbiturates, Ur Screen: NOT DETECTED
Benzodiazepine, Ur Scrn: NOT DETECTED
Cannabinoid 50 Ng, Ur ~~LOC~~: NOT DETECTED
Cocaine Metabolite,Ur ~~LOC~~: NOT DETECTED
MDMA (Ecstasy)Ur Screen: NOT DETECTED
Methadone Scn, Ur: NOT DETECTED
Opiate, Ur Screen: NOT DETECTED
Phencyclidine (PCP) Ur S: NOT DETECTED
Tricyclic, Ur Screen: NOT DETECTED

## 2021-10-30 LAB — RESP PANEL BY RT-PCR (FLU A&B, COVID) ARPGX2
Influenza A by PCR: NEGATIVE
Influenza B by PCR: NEGATIVE
SARS Coronavirus 2 by RT PCR: NEGATIVE

## 2021-10-30 LAB — ACETAMINOPHEN LEVEL: Acetaminophen (Tylenol), Serum: 10 ug/mL (ref 10–30)

## 2021-10-30 LAB — GROUP A STREP BY PCR: Group A Strep by PCR: DETECTED — AB

## 2021-10-30 MED ORDER — AZITHROMYCIN 250 MG PO TABS: 250.0000 mg | ORAL_TABLET | Freq: Every day | ORAL | 0 refills | Status: AC

## 2021-10-30 MED ORDER — IBUPROFEN 800 MG PO TABS
ORAL_TABLET | ORAL | Status: AC
  Administered 2021-10-30: 800 mg via ORAL
  Filled 2021-10-30: qty 1

## 2021-10-30 MED ORDER — IBUPROFEN 800 MG PO TABS: 800.0000 mg | ORAL_TABLET | Freq: Once | ORAL | Status: AC

## 2021-10-30 MED ORDER — AZITHROMYCIN 500 MG PO TABS
500.0000 mg | ORAL_TABLET | Freq: Once | ORAL | Status: AC
  Administered 2021-10-30: 500 mg via ORAL
  Filled 2021-10-30: qty 1

## 2021-10-30 NOTE — Discharge Instructions (Addendum)
You have been diagnosed with strep throat.  Your COVID and flu test are negative and your lab work is otherwise normal.  Take the prescribed antibiotic starting tomorrow, 1 tab daily for 4 days.  Continue to monitor and treat any fevers with Tylenol and Motrin.  Limit her Tylenol to two 500 mg tablets no more than 4 times in 24 hours. ?

## 2021-10-30 NOTE — ED Provider Notes (Signed)
? ? ?[facility] ?Emergency Department Provider Note ? ? ? ? Event Date/Time  ? First MD Initiated Contact with Patient 10/30/21 1035   ?  (approximate) ? ? ?History  ? ?Chills and Drug Overdose ? ? ?HPI ? ?Henry Garcia is a 26 y.o. male with a history of major depressive disorder and alcohol use disorder, who presents to the ED from work.  He presents via EMS, after he vomited at work.  Patient would endorse taking 3 Tylenol tablets at work, for what he describes as joint swelling bilaterally.  Patient describes since the night prior, he woke with malaise and generalized body aches.  He noted some subjective fevers as well as some chills.  Symptoms continued while at work, and patient took some medicine he had in his work locker for symptom relief.  Patient notified his coworkers of his symptoms, and when he mentioned that he had taken 3 Tylenol tabs, day called EMS concerned that the patient had overdosed.  Patient denies intentional harm with the 3 tablets.  He denies any SI or HI at this time.  He presents to the ED with a low-grade fever on arrival. ? ?Physical Exam  ? ?Triage Vital Signs: ?ED Triage Vitals  ?Enc Vitals Group  ?   BP 10/30/21 1027 119/80  ?   Pulse Rate 10/30/21 1027 (!) 107  ?   Resp 10/30/21 1027 18  ?   Temp 10/30/21 1027 99.1 ?F (37.3 ?C)  ?   Temp Source 10/30/21 1027 Oral  ?   SpO2 10/30/21 1027 99 %  ?   Weight 10/30/21 1027 150 lb (68 kg)  ?   Height 10/30/21 1027 5\' 5"  (1.651 m)  ?   Head Circumference --   ?   Peak Flow --   ?   Pain Score 10/30/21 1031 2  ?   Pain Loc --   ?   Pain Edu? --   ?   Excl. in GC? --   ? ? ?Most recent vital signs: ?Vitals:  ? 10/30/21 1146 10/30/21 1311  ?BP: 118/81 120/80  ?Pulse: 100 92  ?Resp: 18 18  ?Temp:    ?SpO2: 99% 99%  ? ? ?General Awake, no distress.  ?HEENT NCAT. PERRL. EOMI. No rhinorrhea. Mucous membranes are moist.  Uvula is midline and tonsils are flat.  No significant oropharyngeal erythema appreciated. ?CV:  Good  peripheral perfusion.  ?RESP:  Normal effort.  ?ABD:  No distention.  ? ?ED Results / Procedures / Treatments  ? ?Labs ?(all labs ordered are listed, but only abnormal results are displayed) ?Labs Reviewed  ?GROUP A STREP BY PCR - Abnormal; Notable for the following components:  ?    Result Value  ? Group A Strep by PCR DETECTED (*)   ? All other components within normal limits  ?CBC WITH DIFFERENTIAL/PLATELET - Abnormal; Notable for the following components:  ? WBC 12.5 (*)   ? Neutro Abs 10.6 (*)   ? Monocytes Absolute 1.2 (*)   ? All other components within normal limits  ?COMPREHENSIVE METABOLIC PANEL - Abnormal; Notable for the following components:  ? Glucose, Bld 114 (*)   ? All other components within normal limits  ?URINALYSIS, ROUTINE W REFLEX MICROSCOPIC - Abnormal; Notable for the following components:  ? Color, Urine YELLOW (*)   ? APPearance CLEAR (*)   ? Ketones, ur 20 (*)   ? All other components within normal limits  ?RESP PANEL BY RT-PCR (FLU  A&B, COVID) ARPGX2  ?ACETAMINOPHEN LEVEL  ?URINE DRUG SCREEN, QUALITATIVE ([facility] ONLY)  ? ? ? ?EKG ? ? ? ?RADIOLOGY ? ? ?No results found. ? ? ?PROCEDURES: ? ?Critical Care performed: No ? ?Procedures ? ? ?MEDICATIONS ORDERED IN ED: ?Medications  ?azithromycin (ZITHROMAX) tablet 500 mg (500 mg Oral Given 10/30/21 1141)  ?ibuprofen (ADVIL) tablet 800 mg (800 mg Oral Given 10/30/21 1147)  ? ? ? ?IMPRESSION / MDM / ASSESSMENT AND PLAN / ED COURSE  ?I reviewed the triage vital signs and the nursing notes. ?             ?               ? ?Differential diagnosis includes, but is not limited to, intentional overdose, acetaminophen toxicity, viral URI, strep pharyngitis, acute viral gastroenteritis ? ? ?Patient to the ED for evaluation of symptoms including body aches, malaise, fever, and vomiting.  He was noted to have taken 3 Tylenol tabs while at work for symptom relief.  He presents in no acute distress, denies any intentional harm.  Patient's exam is reassuring and  his work-up is overall normal including normal CBC, no electrolyte abnormality on CMP, and a normal acetaminophen level.  Patient's diagnosis is consistent with strep pharyngitis. Patient will be discharged home with prescriptions for azithromycin. Patient is to follow up with his primary provider as needed or otherwise directed. Patient is given ED precautions to return to the ED for any worsening or new symptoms. ? ? ?FINAL CLINICAL IMPRESSION(S) / ED DIAGNOSES  ? ?Final diagnoses:  ?Strep throat  ? ? ? ?Rx / DC Orders  ? ?ED Discharge Orders   ? ?      Ordered  ?  azithromycin (ZITHROMAX Z-PAK) 250 MG tablet  Daily       ? 10/30/21 1144  ? ?  ?  ? ?  ? ? ? ?Note:  This document was prepared using Dragon voice recognition software and may include unintentional dictation errors. ? ?  ?Lissa Hoard, PA-C ?10/31/21 1940 ? ?  ?Chesley Noon, MD ?11/03/21 1613 ? ?

## 2021-10-30 NOTE — ED Triage Notes (Addendum)
Presents via EMS from work  states he took 1500 mg of tylenol at work for joint swelling.states he also is having general body aches   states he then developed some chills and vomited times 1 this am denies any fever   has low grade temp on arrival ?

## 2022-03-03 ENCOUNTER — Emergency Department
Admission: EM | Admit: 2022-03-03 | Discharge: 2022-03-03 | Disposition: A | Discharge status: Home / Self Care | Payer: Self-pay | Attending: Emergency Medicine | Admitting: Emergency Medicine

## 2022-03-03 ENCOUNTER — Other Ambulatory Visit: Payer: Self-pay

## 2022-03-03 ENCOUNTER — Encounter: Payer: Self-pay | Admitting: Emergency Medicine

## 2022-03-03 DIAGNOSIS — A749 Chlamydial infection, unspecified: Secondary | ICD-10-CM | POA: Insufficient documentation

## 2022-03-03 LAB — URINALYSIS, ROUTINE W REFLEX MICROSCOPIC
Bacteria, UA: NONE SEEN
Bilirubin Urine: NEGATIVE
Glucose, UA: NEGATIVE mg/dL
Ketones, ur: NEGATIVE mg/dL
Nitrite: NEGATIVE
Protein, ur: NEGATIVE mg/dL
Specific Gravity, Urine: 1.023 (ref 1.005–1.030)
Squamous Epithelial / HPF: NONE SEEN (ref 0–5)
pH: 5 (ref 5.0–8.0)

## 2022-03-03 LAB — CHLAMYDIA/NGC RT PCR (ARMC ONLY)
Chlamydia Tr: DETECTED — AB
N gonorrhoeae: NOT DETECTED

## 2022-03-03 MED ORDER — DOXYCYCLINE HYCLATE 100 MG PO TABS: 100.0000 mg | ORAL_TABLET | Freq: Two times a day (BID) | ORAL | 0 refills | Status: DC

## 2022-03-03 MED ORDER — CEFTRIAXONE SODIUM 1 G IJ SOLR
500.0000 mg | Freq: Once | INTRAMUSCULAR | Status: AC
  Administered 2022-03-03: 500 mg via INTRAMUSCULAR
  Filled 2022-03-03: qty 10

## 2022-03-03 NOTE — ED Triage Notes (Signed)
Patient to ED for genital issues. Patient states burning with urination and that his wife states "his sperm smells different." Patient wife had BV.

## 2022-03-03 NOTE — ED Provider Notes (Signed)
[facility] Provider Note  Patient Contact: 10:48 PM (approximate)   History   Groin Pain   HPI  Henry Garcia is a 26 y.o. male who presents the emergency department complaining of penile discharge, dysuria.  Patient has had symptoms x2 days.  He states that he is here for evaluation of possible BV as his wife was treated for same a week to 10 days ago.  Patient denies any hematuria.  No pelvic or flank pain.  No fevers or chills.  Patient denies any external lesions.  No testicular pain.     Physical Exam   Triage Vital Signs: ED Triage Vitals [03/03/22 1900]  Enc Vitals Group     BP (!) 137/93     Pulse Rate 89     Resp 18     Temp 97.7 F (36.5 C)     Temp Source Oral     SpO2 98 %     Weight 140 lb (63.5 kg)     Height 5\' 5"  (1.651 m)     Head Circumference      Peak Flow      Pain Score 2     Pain Loc      Pain Edu?      Excl. in GC?     Most recent vital signs: Vitals:   03/03/22 1900  BP: (!) 137/93  Pulse: 89  Resp: 18  Temp: 97.7 F (36.5 C)  SpO2: 98%     General: Alert and in no acute distress.  Cardiovascular:  Good peripheral perfusion Respiratory: Normal respiratory effort without tachypnea or retractions. Lungs CTAB.  Gastrointestinal: Bowel sounds 4 quadrants. Soft and nontender to palpation. No guarding or rigidity. No palpable masses. No distention. No CVA tenderness. Genitourinary: No external lesions or chancres. Musculoskeletal: Full range of motion to all extremities.  Neurologic:  No gross focal neurologic deficits are appreciated.  Skin:   No rash noted Other:   ED Results / Procedures / Treatments   Labs (all labs ordered are listed, but only abnormal results are displayed) Labs Reviewed  CHLAMYDIA/NGC RT PCR ([facility] ONLY)           - Abnormal; Notable for the following components:      Result Value   Chlamydia Tr DETECTED (*)    All other components within normal limits  URINALYSIS, ROUTINE W  REFLEX MICROSCOPIC - Abnormal; Notable for the following components:   Color, Urine YELLOW (*)    APPearance CLEAR (*)    Hgb urine dipstick MODERATE (*)    Leukocytes,Ua TRACE (*)    All other components within normal limits     EKG     RADIOLOGY    No results found.  PROCEDURES:  Critical Care performed: No  Procedures   MEDICATIONS ORDERED IN ED: Medications  cefTRIAXone (ROCEPHIN) injection 500 mg (500 mg Intramuscular Given 03/03/22 2255)     IMPRESSION / MDM / ASSESSMENT AND PLAN / ED COURSE  I reviewed the triage vital signs and the nursing notes.                              Differential diagnosis includes, but is not limited to, gonorrhea, chlamydia, nongonococcal urethritis, UTI  Patient's presentation is most consistent with acute illness / injury with system symptoms.   Patient's diagnosis is consistent with chlamydia.  Patient presents to the ED with dysuria, discharge.  Patient states that  his wife had been treated for BV reportedly 7 to 10 days ago and patient developed symptoms 2 to 3 days ago.  Patient with no evidence of lesions or chancres concerning for HSV or syphilis.  Patient did have gonorrhea and Chlamydia testing and was positive for chlamydia.  He was treated with Rocephin and doxycycline.  Patient has informed that this is an STD and that his wife should be either tested or treated for chlamydia.  Patient verbalizes understanding of same.  Return precautions discussed with the patient.. Patient is given ED precautions to return to the ED for any worsening or new symptoms.        FINAL CLINICAL IMPRESSION(S) / ED DIAGNOSES   Final diagnoses:  Chlamydia     Rx / DC Orders   ED Discharge Orders          Ordered    doxycycline (VIBRA-TABS) 100 MG tablet  2 times daily        03/03/22 2250             Note:  This document was prepared using Dragon voice recognition software and may include unintentional dictation errors.    Lanette Hampshire 03/03/22 2307    Sharman Cheek, MD 03/04/22 [phone]

## 2022-03-23 ENCOUNTER — Encounter: Payer: Self-pay | Admitting: Emergency Medicine

## 2022-03-23 ENCOUNTER — Other Ambulatory Visit: Payer: Self-pay

## 2022-03-23 ENCOUNTER — Emergency Department
Admission: EM | Admit: 2022-03-23 | Discharge: 2022-03-23 | Disposition: A | Discharge status: Home / Self Care | Payer: Self-pay | Attending: Emergency Medicine | Admitting: Emergency Medicine

## 2022-03-23 DIAGNOSIS — M5441 Lumbago with sciatica, right side: Secondary | ICD-10-CM | POA: Insufficient documentation

## 2022-03-23 DIAGNOSIS — M5440 Lumbago with sciatica, unspecified side: Secondary | ICD-10-CM

## 2022-03-23 MED ORDER — DEXAMETHASONE SODIUM PHOSPHATE 10 MG/ML IJ SOLN
10.0000 mg | Freq: Once | INTRAMUSCULAR | Status: AC
  Administered 2022-03-23: 10 mg via INTRAMUSCULAR
  Filled 2022-03-23: qty 1

## 2022-03-23 MED ORDER — METHOCARBAMOL 500 MG PO TABS: 500.0000 mg | ORAL_TABLET | Freq: Three times a day (TID) | ORAL | 0 refills | Status: AC | PRN

## 2022-03-23 MED ORDER — METHYLPREDNISOLONE 4 MG PO TBPK: ORAL_TABLET | ORAL | 0 refills | Status: DC

## 2022-03-23 NOTE — ED Triage Notes (Signed)
Low back pain going down right leg for 3 weeks. No injury

## 2022-03-23 NOTE — Discharge Instructions (Addendum)
Take tapered steroid as directed. You can take Robaxin at night before bed. Department if you experience worsening pain, weakness or other new or worsening symptoms.
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
## 2022-10-01 ENCOUNTER — Other Ambulatory Visit: Payer: Self-pay

## 2022-10-01 ENCOUNTER — Encounter: Payer: Self-pay | Admitting: Emergency Medicine

## 2022-10-01 ENCOUNTER — Emergency Department
Admission: EM | Admit: 2022-10-01 | Discharge: 2022-10-01 | Disposition: A | Discharge status: Home / Self Care | Payer: Medicaid Other | Attending: Emergency Medicine | Admitting: Emergency Medicine

## 2022-10-01 DIAGNOSIS — M5442 Lumbago with sciatica, left side: Secondary | ICD-10-CM | POA: Diagnosis present

## 2022-10-01 MED ORDER — METHYLPREDNISOLONE 4 MG PO TBPK: ORAL_TABLET | ORAL | 0 refills | Status: DC

## 2022-10-01 MED ORDER — KETOROLAC TROMETHAMINE 15 MG/ML IJ SOLN
15.0000 mg | Freq: Once | INTRAMUSCULAR | Status: AC
  Administered 2022-10-01: 15 mg via INTRAMUSCULAR
  Filled 2022-10-01: qty 1

## 2022-10-01 NOTE — ED Notes (Addendum)
Pt verbalized understanding of DC instructions. Signing pad did not work.  Sts wife is picking him up.

## 2022-10-01 NOTE — ED Triage Notes (Signed)
  Patient comes in with L leg pain that started earlier while drinking.  Patient states he has had several shots of whiskey while sitting on the couch.  Patient states his L leg went numb and felt pain in his L hip.  Patient states pain/numbness has mostly went away but not 100% back to normal.  Pain 8/10, tender.

## 2022-10-01 NOTE — ED Provider Notes (Signed)
[facility] Provider Note  Patient Contact: 10:38 PM (approximate)   History   Leg Pain   HPI  Henry Garcia is a 27 y.o. male presents to the emerged [place] department with left-sided low back pain with radiation of pain along the posterior aspect of the lower extremity.  No bowel or bladder incontinence or saddle anesthesia.  Patient is able to ambulate without difficulty.      Physical Exam   Triage Vital Signs: ED Triage Vitals  Enc Vitals Group     BP 10/01/22 2200 (!) 140/91     Pulse Rate 10/01/22 2200 (!) 115     Resp 10/01/22 2200 20     Temp 10/01/22 2200 98.2 F (36.8 C)     Temp Source 10/01/22 2200 Oral     SpO2 10/01/22 2200 97 %     Weight 10/01/22 2201 145 lb (65.8 kg)     Height 10/01/22 2201 5\' 5"  (1.651 m)     Head Circumference --      Peak Flow --      Pain Score 10/01/22 2201 8     Pain Loc --      Pain Edu? --      Excl. in [place]? --     Most recent vital signs: Vitals:   10/01/22 2200 10/01/22 2233  BP: (!) 140/91   Pulse: (!) 115 98  Resp: 20 18  Temp: 98.2 F (36.8 C)   SpO2: 97% 97%     General: Alert and in no acute distress. Eyes:  PERRL. EOMI. Head: No acute traumatic findings ENT:      Nose: No congestion/rhinnorhea.      Mouth/Throat: Mucous membranes are moist. Neck: No stridor. No cervical spine tenderness to palpation. Cardiovascular:  Good peripheral perfusion Respiratory: Normal respiratory effort without tachypnea or retractions. Lungs CTAB. Good air entry to the bases with no decreased or absent breath sounds. Gastrointestinal: Bowel sounds 4 quadrants. Soft and nontender to palpation. No guarding or rigidity. No palpable masses. No distention. No CVA tenderness. Musculoskeletal: Full range of motion to all extremities.  Positive straight leg raise, left. Neurologic:  No gross focal neurologic deficits are appreciated.  Skin:   No rash noted Other:   ED Results / Procedures / Treatments    Labs (all labs ordered are listed, but only abnormal results are displayed) Labs Reviewed - No data to display     PROCEDURES:  Critical Care performed: No  Procedures   MEDICATIONS ORDERED IN ED: Medications  ketorolac (TORADOL) 15 MG/ML injection 15 mg (15 mg Intramuscular Given 10/01/22 2232)     IMPRESSION / MDM / [place] / ED COURSE  I reviewed the triage vital signs and the nursing notes.                              Assessment and plan Low back pain 27 year old male presents to the emergency department with left-sided low back pain that radiates down the posterior aspect of the left lower extremity.  Vital signs were reassuring at triage.  On exam, patient was alert, active and nontoxic-appearing.  Patient had a positive straight leg raise test on the left.  Will treat patient with Medrol Dosepak.  Return precautions were given to return with new or worsening symptoms.   FINAL CLINICAL IMPRESSION(S) / ED DIAGNOSES   Final diagnoses:  Acute left-sided low back pain with left-sided sciatica  Rx / DC Orders   ED Discharge Orders          Ordered    methylPREDNISolone (MEDROL DOSEPAK) 4 MG TBPK tablet        10/01/22 2228             Note:  This document was prepared using Dragon voice recognition software and may include unintentional dictation errors.   Vallarie Mare [place], Hershal Coria 10/01/22 2245    Harvest Dark, MD 10/01/22 2303

## 2022-10-01 NOTE — ED Notes (Signed)
ED Provider at bedside. 

## 2022-10-01 NOTE — Discharge Instructions (Addendum)
Take Medrol dose pack as directed.   

## 2022-11-01 ENCOUNTER — Other Ambulatory Visit: Payer: Self-pay

## 2022-11-01 ENCOUNTER — Emergency Department: Payer: Medicaid Other

## 2022-11-01 ENCOUNTER — Emergency Department
Admission: EM | Admit: 2022-11-01 | Discharge: 2022-11-01 | Disposition: A | Discharge status: Home / Self Care | Payer: Medicaid Other | Attending: Emergency Medicine | Admitting: Emergency Medicine

## 2022-11-01 DIAGNOSIS — R531 Weakness: Secondary | ICD-10-CM | POA: Insufficient documentation

## 2022-11-01 DIAGNOSIS — R200 Anesthesia of skin: Secondary | ICD-10-CM | POA: Diagnosis not present

## 2022-11-01 DIAGNOSIS — M791 Myalgia, unspecified site: Secondary | ICD-10-CM

## 2022-11-01 DIAGNOSIS — M7918 Myalgia, other site: Secondary | ICD-10-CM | POA: Insufficient documentation

## 2022-11-01 LAB — BASIC METABOLIC PANEL
Anion gap: 11 (ref 5–15)
BUN: 13 mg/dL (ref 6–20)
CO2: 24 mmol/L (ref 22–32)
Calcium: 9.2 mg/dL (ref 8.9–10.3)
Chloride: 103 mmol/L (ref 98–111)
Creatinine, Ser: 0.71 mg/dL (ref 0.61–1.24)
GFR, Estimated: >60 mL/min (ref >60)
Glucose, Bld: 104 mg/dL — ABNORMAL HIGH (ref 70–99)
Potassium: 3.6 mmol/L (ref 3.5–5.1)
Sodium: 138 mmol/L (ref 135–145)

## 2022-11-01 LAB — CBC
HCT: 40.9 % (ref 39.0–52.0)
Hemoglobin: 14.3 g/dL (ref 13.0–17.0)
MCH: 30.5 pg (ref 26.0–34.0)
MCHC: 35 g/dL (ref 30.0–36.0)
MCV: 87.2 fL (ref 80.0–100.0)
Platelets: 281 10*3/uL (ref 150–400)
RBC: 4.69 MIL/uL (ref 4.22–5.81)
RDW: 12.3 % (ref 11.5–15.5)
WBC: 6.6 10*3/uL (ref 4.0–10.5)
nRBC: 0 % (ref 0.0–0.2)

## 2022-11-01 LAB — URINALYSIS, ROUTINE W REFLEX MICROSCOPIC
Bacteria, UA: NONE SEEN
Bilirubin Urine: NEGATIVE
Glucose, UA: NEGATIVE mg/dL
Ketones, ur: NEGATIVE mg/dL
Leukocytes,Ua: NEGATIVE
Nitrite: NEGATIVE
Protein, ur: NEGATIVE mg/dL
Specific Gravity, Urine: 1.017 (ref 1.005–1.030)
Squamous Epithelial / HPF: NONE SEEN /HPF (ref 0–5)
pH: 6 (ref 5.0–8.0)

## 2022-11-01 LAB — CK: Total CK: 92 U/L (ref 49–397)

## 2022-11-01 NOTE — ED Triage Notes (Signed)
Pt to ED POV for bilateral weakness and numbness to legs since about 1 hour ago. Pt is alert and oriented, no HA or vision changes. Had pt stand in triage, states both legs feel weak. Numbness is to anterior thighs. States feels like doesn't have muscle strength to thighs. Denies NVD or recent illness.

## 2022-11-01 NOTE — ED Provider Notes (Signed)
[facility] Provider Note    Event Date/Time   First MD Initiated Contact with Patient 11/01/22 1349     (approximate)   History   Weakness   HPI  Henry Garcia is a 27 y.o. male who presents with leg numbness and weakness.  Patient was at work when he was walking and suddenly felt that his bilateral legs were weak and numb.  Was to keep walking did not fall.  He did sit down.  Denies low back pain.  The weakness is primarily in the left foot and right thigh and numbness is bilateral.  Denies any upper extremity complaints vision change difficulty speaking or headache.  No bowel or bladder incontinence.  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 --      Excl. in GC? --     Most recent vital signs: Vitals:   11/01/22 1244  BP: 123/78  Pulse: 93  Resp: 14  Temp: 97.8 F (36.6 C)  SpO2: 98%     General: Awake, no distress.  CV:  Good peripheral perfusion.  Resp:  Normal effort.  Abd:  No distention.  Neuro:             Awake, Alert, Oriented x 3  Other:  Aox3, nml speech  PERRL, EOMI, face symmetric, nml tongue movement  5/5  strength in the BL upper and lower extremities  5/5 strength with hip flexion, knee flexion and extension, plantarflexion dorsiflexion in the bilateral lower extremities Subjective decrease sensation over bilateral thighs calves and feet Finger-nose-finger intact BL Patient is able to ambulate, no ataxia   ED Results / Procedures / Treatments  Labs (all labs ordered are listed, but only abnormal results are displayed) Labs Reviewed  BASIC METABOLIC PANEL - Abnormal; Notable for the following components:      Result Value   Glucose, Bld 104 (*)    All other components within normal limits  URINALYSIS, ROUTINE W REFLEX MICROSCOPIC - Abnormal; Notable for the following components:   Color, Urine YELLOW (*)    APPearance CLEAR (*)    Hgb urine dipstick SMALL (*)    All other components within normal limits  CBC  CK  CBG MONITORING, ED     EKG     RADIOLOGY I reviewed and interpreted the CT scan of the brain which does not show any  acute intracranial process    PROCEDURES:  Critical Care performed: No  Procedures   MEDICATIONS ORDERED IN ED: Medications - No data to display   IMPRESSION / MDM / ASSESSMENT AND PLAN / ED COURSE  I reviewed the triage vital signs and the nursing notes.                              Patient's presentation is most consistent with acute complicated illness / injury requiring diagnostic workup.  Differential diagnosis includes, but is not limited to, peripheral neuropathy, lumbar radiculopathy, lumbar compression, functional neurologic syndrome, low suspicion for stroke intracranial hemorrhage, mass, demyelinating disease The patient is a 27 year old male presents with bilateral leg numbness and weakness.  He started rather acutely while he was at work but he was able to ambulate and did not fall.  He endorses numb feeling in bilateral lower extremities as well as weakness which primary involves the left foot and the right thigh.  Denying  back pain bowel or bladder symptoms.  Denies history of similar.  No upper extremity cranial nerves or speech or vision issue.    Patient's vital signs are reassuring.  On exam he looks well.  Cranial nerves intact, sensation and strength upper extremities normal.  He has normal strength with hip flexion knee flexion extension plantarflexion dorsiflexion bilateral lower extremities.  2+ patellar reflexes bilateral lower extremities.  Subjectively decreased sensation over both legs.  Patient is able to ambulate.  With the bilateral leg symptoms this localizes mainly to the spinal cord making me think of cauda equina syndrome or conus medullaris but patient has normal strength and no bowel or bladder symptoms and no back pain making this less likely.  Did consider peripheral nerve issues like Guillain-Barr syndrome the patient has good reflexes and with the rapidity of onset this is also less consistent with GBS.  Did obtain a CT of the head which is negative for acute abnormality.  I have low suspicion for acute brainstem stroke which could result in bilateral leg symptoms or acute lumbar cord compression, again given patient's relatively normal exam.  I did check a CK as well as basic labs to screen for any other is of his numbness and weakness and these are normal.  On reassessment patient is feeling about the same.  When ambulating again he is really complaining of pain in the bilateral thighs which makes more sense to me and could be either referred from his low back or secondary to other process causing myalgias.   However he has no evidence of rhabdo.  Given he is able to ambulate I think he can safely be discharged.  Recommended Tylenol NSAIDs rest.  We discussed reasons to return to ED.       FINAL CLINICAL IMPRESSION(S) / ED DIAGNOSES   Final diagnoses:  Myalgia     Rx / DC Orders   ED Discharge Orders     None        Note:  This document was prepared using Dragon voice  recognition software and may include unintentional dictation errors.   Georga Hacking, MD 11/01/22 [phone]

## 2022-11-01 NOTE — Discharge Instructions (Addendum)
Your CT scan of your brain and your blood work were reassuring.  Take Tylenol Motrin for your pain and rest.  You can also ice your sore muscles.  If you are not able to walk or you develop numbness weakness on one side your body and please return the emergency department.

## 2022-12-02 ENCOUNTER — Emergency Department: Payer: Medicaid Other

## 2022-12-02 ENCOUNTER — Other Ambulatory Visit: Payer: Self-pay

## 2022-12-02 ENCOUNTER — Emergency Department
Admission: EM | Admit: 2022-12-02 | Discharge: 2022-12-02 | Disposition: A | Discharge status: Home / Self Care | Payer: Medicaid Other | Attending: Emergency Medicine | Admitting: Emergency Medicine

## 2022-12-02 DIAGNOSIS — M5410 Radiculopathy, site unspecified: Secondary | ICD-10-CM | POA: Insufficient documentation

## 2022-12-02 DIAGNOSIS — R0789 Other chest pain: Secondary | ICD-10-CM | POA: Diagnosis not present

## 2022-12-02 DIAGNOSIS — R079 Chest pain, unspecified: Secondary | ICD-10-CM | POA: Diagnosis present

## 2022-12-02 LAB — COMPREHENSIVE METABOLIC PANEL
ALT: 24 U/L (ref 0–44)
AST: 25 U/L (ref 15–41)
Albumin: 5 g/dL (ref 3.5–5.0)
Alkaline Phosphatase: 89 U/L (ref 38–126)
Anion gap: 11 (ref 5–15)
BUN: 14 mg/dL (ref 6–20)
CO2: 22 mmol/L (ref 22–32)
Calcium: 9.1 mg/dL (ref 8.9–10.3)
Chloride: 107 mmol/L (ref 98–111)
Creatinine, Ser: 0.85 mg/dL (ref 0.61–1.24)
GFR, Estimated: >60 mL/min (ref >60)
Glucose, Bld: 91 mg/dL (ref 70–99)
Potassium: 3.6 mmol/L (ref 3.5–5.1)
Sodium: 140 mmol/L (ref 135–145)
Total Bilirubin: 0.6 mg/dL (ref 0.3–1.2)
Total Protein: 8.4 g/dL — ABNORMAL HIGH (ref 6.5–8.1)

## 2022-12-02 LAB — CBC
HCT: 43.4 % (ref 39.0–52.0)
Hemoglobin: 14.8 g/dL (ref 13.0–17.0)
MCH: 30.1 pg (ref 26.0–34.0)
MCHC: 34.1 g/dL (ref 30.0–36.0)
MCV: 88.2 fL (ref 80.0–100.0)
Platelets: 336 10*3/uL (ref 150–400)
RBC: 4.92 MIL/uL (ref 4.22–5.81)
RDW: 12.5 % (ref 11.5–15.5)
WBC: 7.3 10*3/uL (ref 4.0–10.5)
nRBC: 0 % (ref 0.0–0.2)

## 2022-12-02 LAB — ETHANOL: Alcohol, Ethyl (B): 223 mg/dL — ABNORMAL HIGH (ref <10)

## 2022-12-02 LAB — TROPONIN I (HIGH SENSITIVITY): Troponin I (High Sensitivity): <2 ng/L (ref <18)

## 2022-12-02 LAB — CBG MONITORING, ED: Glucose-Capillary: 86 mg/dL (ref 70–99)

## 2022-12-02 MED ORDER — ALUM & MAG HYDROXIDE-SIMETH 200-200-20 MG/5ML PO SUSP
30.0000 mL | Freq: Once | ORAL | Status: DC
  Filled 2022-12-02: qty 30

## 2022-12-02 MED ORDER — IOHEXOL 350 MG/ML SOLN
100.0000 mL | Freq: Once | INTRAVENOUS | Status: AC | PRN
  Administered 2022-12-02: 100 mL via INTRAVENOUS

## 2022-12-02 MED ORDER — LIDOCAINE VISCOUS HCL 2 % MT SOLN
15.0000 mL | Freq: Once | OROMUCOSAL | Status: DC
  Filled 2022-12-02: qty 15

## 2022-12-02 NOTE — ED Triage Notes (Signed)
Pt presents to ER via ems from home with c/o left side arm numbness/tingling and chest pain x1 week.  Pt states that he has been having trouble with getting full ROM to his left arm, and states when he lifts his arm, he has pain to left side of neck and left side of chest.  Pt denies any work injuries, but states he does a decent bit of heavy lifting.  No neurological deficits noted. Pt states he had 2 bottles of corona and 3 glasses of whisky today, but states he normally drinks double that. Pt is otherwise A&O x4 and in NAD in triage.

## 2022-12-02 NOTE — ED Provider Notes (Signed)
[facility] Provider Note    Event Date/Time   First MD Initiated Contact with Patient 12/02/22 2117     (approximate)   History   Numbness and Chest Pain   HPI  Henry Garcia is a 27 y.o. male who presents to the emergency department today because of concerns for chest pain and left arm numbness.  Patient does admit to drinking alcohol tonight.  He then started developing some left sided chest pain.  He also felt like his left arm was becoming numb and heavy.  Patient continues to have symptoms at the time my exam.  He denies similar symptoms in the past.  Does state that he does do some lifting during his occupation.     Physical Exam   Triage Vital Signs: ED Triage Vitals  Enc Vitals Group     BP 12/02/22 1959 116/88     Pulse Rate 12/02/22 1959 90     Resp 12/02/22 1959 15     Temp 12/02/22 1959 98.5 F (36.9 C)     Temp Source 12/02/22 1959 Oral     SpO2 12/02/22 1959 98 %     Weight 12/02/22 2000 160 lb (72.6 kg)     Height 12/02/22 2000 5\' 5"  (1.651 m)     Head Circumference --      Peak Flow --      Pain Score 12/02/22 2000 6     Pain Loc --      Pain Edu? --      Excl. in [place]? --     Most recent vital signs: Vitals:   12/02/22 1959  BP: 116/88  Pulse: 90  Resp: 15  Temp: 98.5 F (36.9 C)  SpO2: 98%   General: Awake, alert, oriented. CV:  Good peripheral perfusion. Regular rate and rhythm. Resp:  Normal effort. Lungs clear. Abd:  No distention.  Ext:  No deformity to either arm. Radial pulse 2+ bilaterally.   ED Results / Procedures / Treatments   Labs (all labs ordered are listed, but only abnormal results are displayed) Labs Reviewed  COMPREHENSIVE METABOLIC PANEL - Abnormal; Notable for the following components:      Result Value   Total Protein 8.4 (*)    All other components within normal limits  ETHANOL - Abnormal; Notable for the following components:   Alcohol, Ethyl (B) 223 (*)    All other components  within normal limits  CBC  CBG MONITORING, ED  TROPONIN I (HIGH SENSITIVITY)     EKG  I, Phineas Semen, attending physician, personally viewed and interpreted this EKG  EKG Time: 2006 Rate: 84 Rhythm: normal sinus rhythm Axis: normal Intervals: qtc 385 QRS: narrow ST changes: no st elevation Impression: normal ekg   RADIOLOGY I independently interpreted and visualized the CXR. My interpretation: No pneumonia Radiology interpretation:  IMPRESSION:  No active disease.  Stable chest.   I independently interpreted and visualized the CT angio aorta. My interpretation: No dissection Radiology interpretation:  IMPRESSION:  Negative for acute aortic dissection or aneurysm. Negative for acute  pulmonary embolus. Clear lung fields.    No CT evidence for acute intra-abdominal or pelvic abnormality.     PROCEDURES:  Critical Care performed: No    MEDICATIONS ORDERED IN ED: Medications - No data to display   IMPRESSION / MDM / ASSESSMENT AND PLAN / ED COURSE  I reviewed the triage vital signs and the nursing notes.  Differential diagnosis includes, but is not limited to, alcohol gastritis, dissection, acs, pneumonia  Patient's presentation is most consistent with acute presentation with potential threat to life or bodily function.   Patient presented to the emergency department today because of concerns for chest pain and left arm numbness.  On exam patient has good pulses bilaterally.  Lungs were clear.  EKG without concerning abnormality chest x-ray without pneumonia or pneumothorax.  Given concern for left-sided chest pain and left arm symptoms I did check a CT angio to evaluate his aorta.  This did not show any concerning findings.  Do wonder if patient is suffering from alcoholic gastritis.  Terms of the arm numbness he could be suffering from cervical radiculopathy given occupation.  Will plan on discharging to follow up with  PCP   FINAL CLINICAL IMPRESSION(S) / ED DIAGNOSES   Final diagnoses:  Atypical chest pain  Radiculopathy, unspecified spinal region     Note:  This document was prepared using Dragon voice recognition software and may include unintentional dictation errors.    Phineas Semen, MD 12/02/22 [phone]

## 2022-12-02 NOTE — ED Notes (Signed)
CBG 86. 

## 2022-12-02 NOTE — Discharge Instructions (Signed)
Please seek medical attention for any high fevers, chest pain, shortness of breath, change in behavior, persistent vomiting, bloody stool or any other new or concerning symptoms.  

## 2022-12-02 NOTE — ED Notes (Signed)
ED Provider at bedside. 

## 2022-12-15 ENCOUNTER — Telehealth: Payer: Medicaid Other

## 2022-12-15 ENCOUNTER — Encounter: Payer: Self-pay | Admitting: Physician Assistant

## 2022-12-18 ENCOUNTER — Emergency Department: Payer: Medicaid Other

## 2022-12-18 ENCOUNTER — Emergency Department
Admission: EM | Admit: 2022-12-18 | Discharge: 2022-12-18 | Disposition: A | Discharge status: Home / Self Care | Payer: Medicaid Other | Attending: Emergency Medicine | Admitting: Emergency Medicine

## 2022-12-18 ENCOUNTER — Other Ambulatory Visit: Payer: Self-pay

## 2022-12-18 DIAGNOSIS — R0789 Other chest pain: Secondary | ICD-10-CM | POA: Diagnosis not present

## 2022-12-18 DIAGNOSIS — R079 Chest pain, unspecified: Secondary | ICD-10-CM | POA: Diagnosis present

## 2022-12-18 LAB — BASIC METABOLIC PANEL
Anion gap: 13 (ref 5–15)
BUN: 15 mg/dL (ref 6–20)
CO2: 22 mmol/L (ref 22–32)
Calcium: 8.9 mg/dL (ref 8.9–10.3)
Chloride: 105 mmol/L (ref 98–111)
Creatinine, Ser: 0.95 mg/dL (ref 0.61–1.24)
GFR, Estimated: >60 mL/min (ref >60)
Glucose, Bld: 91 mg/dL (ref 70–99)
Potassium: 3.3 mmol/L — ABNORMAL LOW (ref 3.5–5.1)
Sodium: 140 mmol/L (ref 135–145)

## 2022-12-18 LAB — CBC
HCT: 38.5 % — ABNORMAL LOW (ref 39.0–52.0)
Hemoglobin: 13.3 g/dL (ref 13.0–17.0)
MCH: 30 pg (ref 26.0–34.0)
MCHC: 34.5 g/dL (ref 30.0–36.0)
MCV: 86.9 fL (ref 80.0–100.0)
Platelets: 272 10*3/uL (ref 150–400)
RBC: 4.43 MIL/uL (ref 4.22–5.81)
RDW: 12.7 % (ref 11.5–15.5)
WBC: 6.9 10*3/uL (ref 4.0–10.5)
nRBC: 0 % (ref 0.0–0.2)

## 2022-12-18 LAB — TROPONIN I (HIGH SENSITIVITY): Troponin I (High Sensitivity): <2 ng/L (ref <18)

## 2022-12-18 NOTE — ED Triage Notes (Signed)
Pt to ed from home for CP. Pt has been here recently for same. Pt works in a Engineer, maintenance (IT). Pt states he drinks enough water. Pt has been having chest pain all week. Pt is caox4, in no acute distress.

## 2022-12-18 NOTE — ED Provider Notes (Signed)
[facility] Provider Note    Event Date/Time   First MD Initiated Contact with Patient 12/18/22 2233     (approximate)   History   Chief Complaint: Chest Pain   HPI  Henry Garcia is a 27 y.o. male with a past history of Wolff-Parkinson-White status post ablation who comes the ED complaining of left-sided chest pain that radiates into his left upper arm.  Intermittent, occurring 2 or 3 times a day.  No aggravating or alleviating factors.  Not exertional, not pleuritic.  No shortness of breath diaphoresis or vomiting.  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     Physical Exam   Triage Vital Signs: ED Triage Vitals  Enc Vitals Group     BP 12/18/22 2157 (!) 144/82     Pulse Rate 12/18/22 2157 87     Resp 12/18/22 2157 16     Temp 12/18/22 2157 98 F (36.7 C)     Temp Source 12/18/22 2157 Oral     SpO2 12/18/22 2157 98 %     Weight 12/18/22 2153 158 lb 11.7 oz (72 kg)     Height 12/18/22 2153 5\' 5"  (1.651 m)     Head Circumference --      Peak Flow --      Pain Score 12/18/22 2153 3     Pain Loc --      Pain Edu? --      Excl. in [place]? --     Most recent vital signs: Vitals:   12/18/22 2157  BP: (!) 144/82  Pulse: 87  Resp: 16  Temp: 98 F (36.7 C)  SpO2: 98%    General: Awake, no distress.  CV:  Good peripheral perfusion.  Regular rate and rhythm.  Normal distal pulses Resp:  Normal effort.  Clear to auscultation bilaterally Abd:  No distention.  Soft nontender Other:  Left chest wall tender to the touch at the pectoralis which reproduces his pain.   ED Results / Procedures / Treatments   Labs (all labs ordered are listed, but only abnormal results are displayed) Labs Reviewed  BASIC METABOLIC PANEL - Abnormal; Notable for the following components:      Result Value   Potassium 3.3 (*)    All other components within normal limits  CBC - Abnormal; Notable for the following  components:   HCT 38.5 (*)    All other components within normal limits  TROPONIN I (HIGH SENSITIVITY)     EKG Interpreted by me Normal sinus rhythm rate of 94.  Normal axis, normal intervals.  Normal QRS ST segments and T waves.   RADIOLOGY Chest x-ray interpreted by me, appears normal.  Radiology report reviewed   PROCEDURES:  Procedures   MEDICATIONS ORDERED IN ED: Medications - No data to display   IMPRESSION / MDM / ASSESSMENT AND PLAN / ED COURSE  I reviewed the triage vital signs and the nursing notes.  DDx: Chest wall strain, pneumothorax, arrhythmia, electrolyte abnormality, dehydration  Patient's presentation is most consistent with acute presentation with potential threat to life or bodily function.  Patient presents with left chest pain, reproducible, clinically musculoskeletal.  This is consistent with his occupational history, overuse injury from moving heavy objects.  EKG chest x-ray labs are all unremarkable.  He is calm and comfortable, vitals unremarkable.  Reassuring examination.  Counseled on heat therapy, NSAIDs, work note provided       FINAL CLINICAL  IMPRESSION(S) / ED DIAGNOSES   Final diagnoses:  Chest wall pain     Rx / DC Orders   ED Discharge Orders     None        Note:  This document was prepared using Dragon voice recognition software and may include unintentional dictation errors.   Sharman Cheek, MD 12/18/22 [phone]

## 2023-01-18 ENCOUNTER — Emergency Department: Payer: MEDICAID

## 2023-01-18 ENCOUNTER — Other Ambulatory Visit: Payer: Self-pay

## 2023-01-18 ENCOUNTER — Emergency Department
Admission: EM | Admit: 2023-01-18 | Discharge: 2023-01-18 | Disposition: A | Discharge status: Home / Self Care | Payer: MEDICAID | Attending: Emergency Medicine | Admitting: Emergency Medicine

## 2023-01-18 DIAGNOSIS — R55 Syncope and collapse: Secondary | ICD-10-CM | POA: Diagnosis present

## 2023-01-18 LAB — CBC
HCT: 40.9 % (ref 39.0–52.0)
Hemoglobin: 13.8 g/dL (ref 13.0–17.0)
MCH: 29.9 pg (ref 26.0–34.0)
MCHC: 33.7 g/dL (ref 30.0–36.0)
MCV: 88.7 fL (ref 80.0–100.0)
Platelets: 282 10*3/uL (ref 150–400)
RBC: 4.61 MIL/uL (ref 4.22–5.81)
RDW: 12.9 % (ref 11.5–15.5)
WBC: 4.5 10*3/uL (ref 4.0–10.5)
nRBC: 0 % (ref 0.0–0.2)

## 2023-01-18 LAB — BASIC METABOLIC PANEL
Anion gap: 11 (ref 5–15)
BUN: 11 mg/dL (ref 6–20)
CO2: 24 mmol/L (ref 22–32)
Calcium: 9 mg/dL (ref 8.9–10.3)
Chloride: 104 mmol/L (ref 98–111)
Creatinine, Ser: 0.64 mg/dL (ref 0.61–1.24)
GFR, Estimated: >60 mL/min (ref >60)
Glucose, Bld: 89 mg/dL (ref 70–99)
Potassium: 3.5 mmol/L (ref 3.5–5.1)
Sodium: 139 mmol/L (ref 135–145)

## 2023-01-18 NOTE — ED Triage Notes (Signed)
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 When he awoke he called EMS. +ETOH today

## 2023-01-18 NOTE — Discharge Instructions (Signed)
You are seen in the emergency department following an episode of passing out.  You had a CT scan done of your head that was normal.  Your lab work was normal.  It is importantly follow-up closely with your primary care physician.  Decrease the amount of alcohol that you are taking in on a daily basis.  Stay hydrated and drink plenty of fluids.  Make sure you are eating throughout the day.  If you have another episode of passing out it is importantly return to the emergency department for reevaluation.

## 2023-01-18 NOTE — ED Provider Notes (Signed)
[facility] Provider Note    Event Date/Time   First MD Initiated Contact with Patient 01/18/23 1828     (approximate)   History   Altered Mental Status   HPI  Henry Garcia is a 27 y.o. male no significant past medical history who presents to the emergency department following an episode of syncope.  Patient states that he was at home working on some Internet things and then woke up on the ground with some blood on his nose.  States that he did not remember what happened or if he passed out.  Does endorse drinking alcohol today and states that he drank slightly more alcohol than normal.  Does drink alcohol on a daily basis.  Denies any drug use.  States that he was quickly able to wake up and called his friend who told him to come get checked out in the emergency department because he could have a concussion.  Denies any headache or change in vision.  Denies any chest pain or shortness of breath.  Denies any family history of sudden cardiac death.  No history of syncope in the past.  Denies urinary or bowel incontinence.  Denies tongue biting.  No history of seizure.     Physical Exam   Triage Vital Signs: ED Triage Vitals  Enc Vitals Group     BP 01/18/23 1827 120/76     Pulse Rate 01/18/23 1827 84     Resp 01/18/23 1827 20     Temp 01/18/23 1827 98.2 F (36.8 C)     Temp Source 01/18/23 1827 Oral     SpO2 01/18/23 1827 99 %     Weight --      Height 01/18/23 1828 5\' 5"  (1.651 m)     Head Circumference --      Peak Flow --      Pain Score --      Pain Loc --      Pain Edu? --      Excl. in GC? --     Most recent vital signs: Vitals:   01/18/23 1830 01/18/23 2044  BP: 98/84 101/82  Pulse: 82 88  Resp: 12 20  Temp:  98.2 F (36.8 C)  SpO2: 99% 99%    Physical Exam Constitutional:      Appearance: He is well-developed.  HENT:     Head:     Comments: Abrasion to the right nose Eyes:     Conjunctiva/sclera: Conjunctivae normal.   Cardiovascular:     Rate and Rhythm: Regular rhythm.     Heart sounds: No murmur heard. Pulmonary:     Effort: No respiratory distress.     Breath sounds: No wheezing.  Musculoskeletal:        General: Normal range of motion.     Cervical back: Normal range of motion.  Skin:    General: Skin is warm.  Neurological:     Mental Status: He is alert. Mental status is at baseline.     GCS: GCS eye subscore is 4. GCS verbal subscore is 5. GCS motor subscore is 6.     Cranial Nerves: Cranial nerves 2-12 are intact.     Sensory: Sensation is intact.     Motor: Motor function is intact.     Coordination: Coordination is intact.     IMPRESSION / MDM / ASSESSMENT AND PLAN / ED COURSE  I reviewed the triage vital signs and the nursing notes.  Differential diagnosis including  intracranial hemorrhage, intoxication, syncope, seizure, electrolyte abnormality, ACS  EKG  I, Corena Herter, the attending physician, personally viewed and interpreted this ECG.   Rate: Normal  Rhythm: Normal sinus  Axis: Normal  Intervals: Short PR interval -no obvious delta waves  ST&T Change: None  No tachycardic or bradycardic dysrhythmias while on cardiac telemetry.  RADIOLOGY I independently reviewed imaging, my interpretation of imaging: CT scan of the head without signs of intracranial hemorrhage or infarction  LABS (all labs ordered are listed, but only abnormal results are displayed) Labs interpreted as -    Labs Reviewed  CBC  BASIC METABOLIC PANEL     MDM    CT scan of the head done within 6 hours of episode, have a low suspicion for subarachnoid hemorrhage.  No significant headache.  Has a nonfocal neurologic exam at this time.  Low suspicion for seizure given that he has no tongue biting or incontinence and no obvious postictal period.  Possibly from acute intoxication, did discussed at length decreasing the amount of alcohol that he drinks.  Have low suspicion for alcohol withdrawal  seizure given that he drank alcohol earlier today.  Appears clinically sober at this time.  No significant electrolyte abnormalities and normal troponin.  Short PR interval on EKG but otherwise normal intervals and no obvious delta waves, no family history of sudden cardiac death.  Encouraged on decreasing alcohol intake.  Discussed follow-up with primary care physician.  Given return precautions for any worsening symptoms.   PROCEDURES:  Critical Care performed: No  Procedures  Patient's presentation is most consistent with acute illness / injury with system symptoms.   MEDICATIONS ORDERED IN ED: Medications - No data to display  FINAL CLINICAL IMPRESSION(S) / ED DIAGNOSES   Final diagnoses:  Syncope, unspecified syncope type     Rx / DC Orders   ED Discharge Orders     None        Note:  This document was prepared using Dragon voice recognition software and may include unintentional dictation errors.   Corena Herter, MD 01/19/23 [phone]

## 2023-02-11 ENCOUNTER — Telehealth: Payer: MEDICAID | Admitting: Family Medicine

## 2023-02-11 DIAGNOSIS — N489 Disorder of penis, unspecified: Secondary | ICD-10-CM | POA: Diagnosis not present

## 2023-02-11 NOTE — Progress Notes (Signed)
Virtual Visit Consent   Henry Garcia, you are scheduled for a virtual visit with a [facility] provider today. Just as with appointments in the office, your consent must be obtained to participate. Your consent will be active for this visit and any virtual visit you may have with one of our providers in the next 365 days. If you have a MyChart account, a copy of this consent can be sent to you electronically.  As this is a virtual visit, video technology does not allow for your provider to perform a traditional examination. This may limit your provider's ability to fully assess your condition. If your provider identifies any concerns that need to be evaluated in person or the need to arrange testing (such as labs, EKG, etc.), we will make arrangements to do so. Although advances in technology are sophisticated, we cannot ensure that it will always work on either your end or our end. If the connection with a video visit is poor, the visit may have to be switched to a telephone visit. With either a video or telephone visit, we are not always able to ensure that we have a secure connection.  By engaging in this virtual visit, you consent to the provision of healthcare and authorize for your insurance to be billed (if applicable) for the services provided during this visit. Depending on your insurance coverage, you may receive a charge related to this service.  I need to obtain your verbal consent now. Are you willing to proceed with your visit today? Henry Garcia has provided verbal consent on 02/11/2023 for a virtual visit (video or telephone). Georgana Curio, FNP  Date: 02/11/2023 4:29 PM  Virtual Visit via Video Note   I, Georgana Curio, connected with  Henry Garcia  ([id number], [birth year]) on 02/11/23 at  4:30 PM EDT by a video-enabled telemedicine application and verified that I am speaking with the correct person using two identifiers.  Location: Patient: Virtual Visit Location Patient:  Home Provider: Virtual Visit Location Provider: Home Office   I discussed the limitations of evaluation and management by telemedicine and the availability of in person appointments. The patient expressed understanding and agreed to proceed.    History of Present Illness: Henry Garcia is a 27 y.o. who identifies as a male who was assigned male at birth, and is being seen today for blisters and pimples on penis. He also has 2 white patches on penis. He has sex with his exwife but does not know who else she has sex with. .  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   Alcohol dependence (HCC) 10/12/2021    Allergies:  Allergies  Allergen Reactions   Amoxicillin Itching   Clarithromycin Itching   Cortisone Other (See Comments)    hiccups   Medications:  Current Outpatient Medications:    acetaminophen (TYLENOL) 500 MG tablet, Take 500 mg by mouth every 6 (six) hours as needed., Disp: , Rfl:    doxycycline (VIBRA-TABS) 100 MG tablet, Take 1 tablet (100 mg total) by mouth 2 (two) times daily., Disp: 14 tablet, Rfl: 0   methylPREDNISolone (MEDROL DOSEPAK) 4 MG TBPK tablet, 6,5,4,3,2,1, Disp: 21 tablet, Rfl: 0  Observations/Objective: Patient is well-developed, well-nourished in no acute distress.  Resting comfortably  at home.  Head is normocephalic, atraumatic.  No labored breathing.  Speech is clear and coherent with logical content.  Patient is alert and oriented at baseline.    Assessment and Plan: 1. Penis disorder  Instructed to proceed to UC for STI testing.   Follow Up Instructions: I discussed the assessment and treatment plan with the patient. The patient was provided an opportunity to ask questions and all were answered. The patient agreed  with the plan and demonstrated an understanding of the instructions.  A copy of instructions were sent to the patient via MyChart unless otherwise noted below.     The patient was advised to call back or seek an in-person evaluation if the symptoms worsen or if the condition fails to improve as anticipated.  Time:  I spent 10 minutes with the patient via telehealth technology discussing the above problems/concerns.    Georgana Curio, FNP

## 2023-02-11 NOTE — Patient Instructions (Signed)
Genital Herpes Genital herpes is a common sexually transmitted infection (STI) that is caused by a virus. The virus spreads from person to person through contact with a sore, infected saliva, or infected skin. The virus can cause itching, blisters, and sores around the genitals or rectum. During an outbreak of infection, symptoms may last for several days and then go away. However, the virus remains in the body, so more outbreaks may happen in the future. The time between outbreaks varies and can be from months to years. Genital herpes can affect anyone. It is particularly concerning for pregnant women because the virus can be passed to the baby during delivery. Genital herpes is also a concern for people who have a weak disease-fighting system (immune system). What are the causes? This condition is caused by the herpes simplex virus, type 1 or type 2 (HSV-1 or HSV-2). The virus may spread through: Sexual contact with an infected person, including vaginal, anal, and oral sex. Contact with a herpes sore. The skin. This means that you can get herpes from an infected partner even if there are no blisters or sores present. Your partner may not know that he or she is infected. What increases the risk? You are more likely to develop this condition if: You have sex with many partners. You do not use latex or polyurethane condoms during sex. What are the signs or symptoms? Most people do not have symptoms or they have mild symptoms that may be mistaken for other skin problems. Symptoms may include: Small, red bumps near the genitals, rectum, or mouth. These bumps turn into blisters and then sores. Flu-like (influenza-like) symptoms, including: Fever. Body aches. Swollen lymph nodes. Headache. Painful urination. Pain and itching in the genital area or rectal area. Vaginal discharge. Tingling or shooting pain in the legs and buttocks. Generally, symptoms are more severe and last longer during the first  (primary) outbreak. Influenza-like symptoms are also more common during the primary outbreak. How is this diagnosed? This condition may be diagnosed based on: A physical exam. Your medical history. Blood tests. A test of a fluid sample (culture) from an open sore. How is this treated? There is no cure for this condition, but treatment with antiviral medicines can do the following: Speed up healing and relieve symptoms. Help to reduce the spread of the virus to sexual partners. Limit the chance of future outbreaks, or make future outbreaks shorter. Lessen symptoms of future outbreaks. Your health care provider may also recommend over-the-counter medicines to help with pain and itching. Follow these instructions at home: If you have an outbreak:  Keep the affected areas dry and clean. Avoid rubbing or touching blisters and sores. If you do touch blisters or sores: Wash your hands thoroughly with soap and water for at least 20 seconds. If soap and water are not available, use an alcohol-based hand sanitizer. Do not touch your eyes afterward. Sexual activity Do not have sexual contact during active outbreaks. Practice safe sex. Herpes can spread even if your partner does not have blisters or sores. Latex or polyurethane condoms and male condoms may help prevent the spread of the herpes virus. Managing pain and discomfort If directed, put ice on the painful area. To do this: Put ice in a plastic bag. Place a towel between your skin and the bag. Leave the ice on for 20 minutes, 2-3 times a day. Remove the ice if your skin turns bright red. This is very important. If you cannot feel pain, heat, or  cold, you have a greater risk of damage to the area. If told, take a cool sitz bath to help relieve pain or itching. A sitz bath is a water bath that you take while sitting down in water that is deep enough to cover your hips and buttocks. General instructions Take over-the-counter and  prescription medicines only as told by your health care provider. If you were prescribed an antiviral medicine, use it as told by your health care provider. Do not stop using the antiviral even if you start to feel better. Keep all follow-up visits. This is important. How is this prevented? Use condoms. Although you can get genital herpes during sexual contact even with the use of a condom, a condom can provide some protection. Avoid having multiple sexual partners. Talk with your sexual partner about any symptoms either of you may have. Also, talk with your partner about any history of STIs. Do not have sexual contact if you have active symptoms of genital herpes. Contact a health care provider if: Your symptoms are not improving with medicine. Your symptoms return, or you have new symptoms. You have a fever. You have abdominal pain. You have redness, swelling, or pain in your eye. You notice new sores on other parts of your body. You have had herpes and you become pregnant or plan to become pregnant. Get help right away if: You have symptoms of viral meningitis. This is rare but may happen if the virus spreads to the brain. Symptoms may include: Severe headache or stiff neck. Muscle aches. Nausea and vomiting. Sensitivity to light. Summary Genital herpes is a common sexually transmitted infection (STI) that is caused by the herpes simplex virus, type 1 or type 2 (HSV-1 or HSV-2). These viruses are most often spread through sexual contact with an infected person. You are more likely to develop this condition if you have sex with many partners or you do not use condoms during sex. Most people do not have symptoms or have mild symptoms that may be mistaken for other skin problems. Symptoms occur as outbreaks that may happen months or years apart. There is no cure for this condition, but treatment with oral antiviral medicines can reduce symptoms, reduce the chance of spreading the virus to  a partner, prevent future outbreaks, or shorten future outbreaks. This information is not intended to replace advice given to you by your health care provider. Make sure you discuss any questions you have with your health care provider. Document Revised: 04/09/2021 Document Reviewed: 04/09/2021 Elsevier Patient Education  2024 [employer] Preventing Sexually Transmitted Infections, Adult Sexually transmitted infections (STIs) are spread from person to person (are contagious). They are spread, or transmitted, during sex. The sex may be vaginal, anal, or oral. STIs can be passed during sexual contact with skin, genitals, mouth, or rectum. They may spread through body fluids, such as saliva, semen, blood, vaginal mucus, and urine. STIs are very common. They can happen in people of all ages. Some common STIs are: Herpes. Hepatitis B. Chlamydia. Gonorrhea. Syphilis. Trichomoniasis. Human papillomavirus (HPV). Human immunodeficiency virus (HIV). This can cause acquired immunodeficiency syndrome (AIDS). How can STIs affect me? You may not have symptoms with an STI. Even if you do not have symptoms, you can still spread the infection to others. You also still need treatment. STIs can be treated. Some STIs can be cured. Other STIs cannot be cured and will affect you for the rest of your life. Certain STIs may: Require you to take medicine  for the rest of your life. Affect your ability to have children. Increase your risk for getting other STIs. Increase your risk of getting certain conditions. These may include: Cervical cancer. Pelvic inflammatory disease (PID). Organ damage or damage to other parts of your body. This can happen if the infection spreads. Cause problems during pregnancy. STIs may be spread to the baby during pregnancy or birth. Females tend to have more severe problems from STIs than males. What can increase my risk? You may be more at risk for an STI if: You do not use  protection during sex. You have more than one sex partner. You have a sex partner who has other sex partners. You have sex with a person who has an STI. You have an STI, or you have had an STI before. You inject drugs or have a sex partner who injects drugs. What actions can I take to prevent STIs? The only way to fully prevent STIs is not to have sex of any kind. This is called practicing abstinence. If you are sexually active, you can protect yourself and others by taking these actions to lower your risk of getting an STI: Lifestyle Have only one sex partner or limit the number of sex partners you have. Avoid having sex after you have alcohol or drugs. Alcohol and drugs can affect your ability to make good choices. This can lead to risky sexual behaviors. Go to prevention counseling. This can teach you how to avoid getting an STI. Barrier protection  Use methods to stop body fluids from being exchanged between partners during sex (barrier protection). These methods can be used during oral, vaginal, or anal sex. They include: External condom, for males. Internal condom, for females. Dental dam. Use a new barrier method for every sex act from start to finish. Know that a barrier method may not protect you from all STIs. Some STIs, such as herpes, are spread through skin-to-skin contact. Avoid all sexual contact if you or a partner has herpes and there is an active flare with open sores. Birth control pills, injections, implants, and intrauterine devices (IUDs) do not protect against STIs. To prevent both STIs and pregnancy, always use a condom with a second form of birth control. General information Ask your health care provider about taking pre-exposure prophylaxis (PrEP) to prevent HIV. Stay up to date on your vaccines. Some vaccines can lower your risk of getting certain STIs. These include: Hepatitis B vaccine. HPV vaccine. This is recommended for people up to age 68. Get tested for  STIs. Have your partners get tested, too. If you test positive for an STI, follow recommendations from your health care provider about treatment. Make sure your sex partners are tested and treated as well. Where to find more information Learn more about STIs from: Centers for Disease Control and Prevention (CDC): More information about certain STIs: TonerPromos.no Places to get sexual health counseling and treatment for free or at a low cost: gettested.TonerPromos.no U.[place]. Department of Health and Human Services [facility]): TravelLesson.ca This information is not intended to replace advice given to you by your health care provider. Make sure you discuss any questions you have with your health care provider. Document Revised: 07/15/2022 Document Reviewed: 12/18/2021 Elsevier Patient Education  2024 [employer].

## 2023-02-17 DIAGNOSIS — Z5321 Procedure and treatment not carried out due to patient leaving prior to being seen by health care provider: Secondary | ICD-10-CM | POA: Insufficient documentation

## 2023-02-17 DIAGNOSIS — R1031 Right lower quadrant pain: Secondary | ICD-10-CM | POA: Insufficient documentation

## 2023-02-18 ENCOUNTER — Encounter: Payer: Self-pay | Admitting: Emergency Medicine

## 2023-02-18 ENCOUNTER — Emergency Department
Admission: EM | Admit: 2023-02-18 | Discharge: 2023-02-18 | Discharge status: Against Medical Advice | Payer: MEDICAID | Attending: Emergency Medicine | Admitting: Emergency Medicine

## 2023-02-18 ENCOUNTER — Other Ambulatory Visit: Payer: Self-pay

## 2023-02-18 NOTE — ED Notes (Addendum)
Pt to STAT desk to inquire over wait time; pt informed of such; pt then becomes upset demanding this nurse's name and number to call to complain, pt given both; pt continues to be upset, yelling, approaching nurse; pt informed to step back and have a sit while waiting; Scientific laboratory technician to desk to explain to pt again to have a seat; pt then exits ED lobby; pt is now outside lobby door windows with phone reportedly video taping; security outside to speak with pt; pt st he is Child psychotherapist PD for assistance

## 2023-02-18 NOTE — ED Notes (Addendum)
[place] [employer] officer in to speak with this nurse regarding events; explained that pt has presented to seek medical attention and will be allowed to wait for a provider to examine him; officer st he will inform pt of his right to be seen & speak to pt regarding his behavior; [place] security out to assist with pt as well; despite pt being encouraged to stay and finish triage pt st he is leaving and wants to report nurse for "being rude and making him leave"

## 2023-02-18 NOTE — ED Triage Notes (Signed)
EMS brings pt in from home for reports of rt lower & mid abd pain since Monday; denies any accomp symptoms

## 2023-03-10 ENCOUNTER — Telehealth: Payer: MEDICAID | Admitting: Nurse Practitioner

## 2023-03-10 ENCOUNTER — Telehealth: Payer: MEDICAID

## 2023-03-10 DIAGNOSIS — R079 Chest pain, unspecified: Secondary | ICD-10-CM | POA: Diagnosis not present

## 2023-03-10 NOTE — Progress Notes (Signed)
Virtual Visit Consent   Henry Garcia, you are scheduled for a virtual visit with a [facility] provider today. Just as with appointments in the office, your consent must be obtained to participate. Your consent will be active for this visit and any virtual visit you may have with one of our providers in the next 365 days. If you have a MyChart account, a copy of this consent can be sent to you electronically.  As this is a virtual visit, video technology does not allow for your provider to perform a traditional examination. This may limit your provider's ability to fully assess your condition. If your provider identifies any concerns that need to be evaluated in person or the need to arrange testing (such as labs, EKG, etc.), we will make arrangements to do so. Although advances in technology are sophisticated, we cannot ensure that it will always work on either your end or our end. If the connection with a video visit is poor, the visit may have to be switched to a telephone visit. With either a video or telephone visit, we are not always able to ensure that we have a secure connection.  By engaging in this virtual visit, you consent to the provision of healthcare and authorize for your insurance to be billed (if applicable) for the services provided during this visit. Depending on your insurance coverage, you may receive a charge related to this service.  I need to obtain your verbal consent now. Are you willing to proceed with your visit today? Barnaby Fick has provided verbal consent on 03/10/2023 for a virtual visit (video or telephone). Viviano Simas, FNP  Date: 03/10/2023 6:16 PM  Virtual Visit via Video Note   I, Viviano Simas, connected with  Henry Garcia  ([id number], [birth year]) on 03/10/23 at  6:15 PM EDT by a video-enabled telemedicine application and verified that I am speaking with the correct person using two identifiers.  Location: Patient: Virtual Visit Location Patient:  Home Provider: Virtual Visit Location Provider: Home Office   I discussed the limitations of evaluation and management by telemedicine and the availability of in person appointments. The patient expressed understanding and agreed to proceed.    History of Present Illness: Henry Garcia is a 27 y.o. who identifies as a male who was assigned male at birth, and is being seen today with complaints of headache that is accompanied with chest pain   Of note he has a history of Wolff-Parkinson-White status post ablation 5-6 years ago He was released from Cardiology at that time  (was being seen at [facility])   He woke up at 4:45 and felt very congested in his nose This is causing him to have a hard time sleeping   He has also been experiencing left sided chest pain that extends into his left arm He feels at times that his left arm will go numb or become weak and at that time he will become dizzy  These episodes have been occurring for one week   Denies any recent illness Denies any trauma/accident or falls  He does do "heavy pushing" at work  Denies new exercise routine   He has had episodes like this in the past   Notes history of ED visit in June for chest wall pain (ECG normal at that time) chest xray also performed. Dx with musculoskeletal pain at that time after negative work up  ED in July for syncope ECG normal head CT performed as well that was normal   Also  of note he had a CT Angio Chest in May that was normal   He has a [facility] at [facility]    Problems:  Patient Active Problem List   Diagnosis Date Noted   Adjustment disorder with mixed disturbance of emotions and conduct 10/13/2021   Alcohol abuse 10/13/2021   Major depressive disorder, recurrent severe without psychotic features (HCC) 10/12/2021   Alcohol dependence (HCC) 10/12/2021    Allergies:  Allergies  Allergen Reactions   Amoxicillin Itching   Clarithromycin Itching   Cortisone Other (See  Comments)    hiccups   Medications: None   Observations/Objective: Patient is well-developed, well-nourished in no acute distress.  Resting comfortably  at home.  Head is normocephalic, atraumatic.  No labored breathing.  Speech is clear and coherent with logical content.  Patient is alert and oriented at baseline.  Able to move all extremities, face is symmetrical   Assessment and Plan:  1. Chest pain, unspecified type  Discussed with patient and reviewed previous ED visits and current symptoms   Suspect that current symptoms are related to medical anxiety from past experiences of palpitations related to WPW Patient is in no acute distress during visit  Two recent ED visits with negative workup-  Discussed with patient that if he feels these symptoms are persistent and more worrisome he is always welcome to return to ED for workup.   Otherwise advised calling primary care in the am to schedule follow up and discuss returning to cardiology for ongoing care to monitor  underlying and ongoing symptoms.   Patient is agreeable with plan, will discuss with wife and seek care as they agree is appropriate tonight.     Follow Up Instructions: I discussed the assessment and treatment plan with the patient. The patient was provided an opportunity to ask questions and all were answered. The patient agreed with the plan and demonstrated an understanding of the instructions.  A copy of instructions were sent to the patient via MyChart unless otherwise noted below.    The patient was advised to call back or seek an in-person evaluation if the symptoms worsen or if the condition fails to improve as anticipated.  Time:  I spent 13 minutes with the patient via telehealth technology discussing the above problems/concerns.    Viviano Simas, FNP

## 2023-04-03 ENCOUNTER — Emergency Department
Admission: EM | Admit: 2023-04-03 | Discharge: 2023-04-03 | Disposition: A | Discharge status: Home / Self Care | Payer: MEDICAID | Attending: Emergency Medicine | Admitting: Emergency Medicine

## 2023-04-03 ENCOUNTER — Other Ambulatory Visit: Payer: Self-pay

## 2023-04-03 ENCOUNTER — Encounter: Payer: Self-pay | Admitting: Emergency Medicine

## 2023-04-03 DIAGNOSIS — Z23 Encounter for immunization: Secondary | ICD-10-CM | POA: Diagnosis not present

## 2023-04-03 DIAGNOSIS — Y92009 Unspecified place in unspecified non-institutional (private) residence as the place of occurrence of the external cause: Secondary | ICD-10-CM | POA: Diagnosis not present

## 2023-04-03 DIAGNOSIS — W25XXXA Contact with sharp glass, initial encounter: Secondary | ICD-10-CM | POA: Diagnosis not present

## 2023-04-03 DIAGNOSIS — S61411A Laceration without foreign body of right hand, initial encounter: Secondary | ICD-10-CM | POA: Diagnosis not present

## 2023-04-03 DIAGNOSIS — S60921A Unspecified superficial injury of right hand, initial encounter: Secondary | ICD-10-CM | POA: Diagnosis present

## 2023-04-03 MED ORDER — TETANUS-DIPHTH-ACELL PERTUSSIS 5-2.5-18.5 LF-MCG/0.5 IM SUSY
0.5000 mL | PREFILLED_SYRINGE | Freq: Once | INTRAMUSCULAR | Status: AC
  Administered 2023-04-03: 0.5 mL via INTRAMUSCULAR
  Filled 2023-04-03: qty 0.5

## 2023-04-03 NOTE — Discharge Instructions (Signed)
Keep the areas clean and dry.  Watch for any signs of infection.  Follow-up with your primary care provider if any continued problems.

## 2023-04-03 NOTE — ED Provider Notes (Signed)
[facility] Provider Note    Event Date/Time   First MD Initiated Contact with Patient 04/03/23 1148     (approximate)   History   Laceration   HPI  Henry Garcia is a 27 y.o. male   presents to the ED with complaint of laceration to his right hand while working on a glass window last night.  Patient states he has multiple small lacerations to his right third, ring and fifth digit and also at his right wrist.  Patient is unsure of his last tetanus which is possibly more than 5 years ago.  Patient has a history of WPW, alcohol dependency.      Physical Exam   Triage Vital Signs: ED Triage Vitals  Encounter Vitals Group     BP 04/03/23 1048 (!) 121/91     Systolic BP Percentile --      Diastolic BP Percentile --      Pulse Rate 04/03/23 1048 97     Resp 04/03/23 1048 18     Temp 04/03/23 1048 98 F (36.7 C)     Temp Source 04/03/23 1048 Oral     SpO2 04/03/23 1048 98 %     Weight 04/03/23 1047 130 lb (59 kg)     Height 04/03/23 1047 5\' 5"  (1.651 m)     Head Circumference --      Peak Flow --      Pain Score 04/03/23 1046 0     Pain Loc --      Pain Education --      Exclude from Growth Chart --     Most recent vital signs: Vitals:   04/03/23 1048  BP: (!) 121/91  Pulse: 97  Resp: 18  Temp: 98 F (36.7 C)  SpO2: 98%     General: Awake, no distress.  CV:  Good peripheral perfusion.  Resp:  Normal effort.  Abd:  No distention.  Other:  Right hand with very superficial laceration to the right volar aspect without foreign body or bleeding, superficial laceration to the third digit dorsally and similar laceration to the fourth and fifth digit.  Range of motion is without restriction both in flexion and extension.  Motor or sensory function intact.  Capillary refills less than 3 seconds.   ED Results / Procedures / Treatments   Labs (all labs ordered are listed, but only abnormal results are displayed) Labs Reviewed - No data to  display    PROCEDURES:  Critical Care performed:   Procedures   MEDICATIONS ORDERED IN ED: Medications  Tdap (BOOSTRIX) injection 0.5 mL (0.5 mLs Intramuscular Given 04/03/23 1258)     IMPRESSION / MDM / ASSESSMENT AND PLAN / ED COURSE  I reviewed the triage vital signs and the nursing notes.   Differential diagnosis includes, but is not limited to, laceration right hand, foreign body, abrasions, need for tetanus immunization.  27 year old male presents to the ED with superficial lacerations that occurred last evening.  Patient is unaware of the last time he had a tetanus immunization but is sure that it has been over 5 years.  Physical exam shows no active bleeding and lacerations are very superficial.  These were cleaned and Steri-Stripped.  Patient was encouraged to watch for any signs of infection and to follow-up with his PCP if any continued problems or urgent care.      Patient's presentation is most consistent with acute complicated illness / injury requiring diagnostic workup.  FINAL CLINICAL IMPRESSION(S) /  ED DIAGNOSES   Final diagnoses:  Laceration of right hand without foreign body, initial encounter     Rx / DC Orders   ED Discharge Orders     None        Note:  This document was prepared using Dragon voice recognition software and may include unintentional dictation errors.   Tommi Rumps, PA-C 04/03/23 1440    Jene Every, MD 04/03/23 1451

## 2023-04-03 NOTE — ED Triage Notes (Signed)
Pt via POV from home. Pt c/o R hand lacerations, states he was working on his glass window last night. Report multiple small lacerations to the R middle, ring, and pinky and hand. Also has a small laceration on the R wrist. Bleeding controlled. Unknown last tetanus. Pt is A&Ox4 and NAD, ambulatory to triage.

## 2023-05-01 DIAGNOSIS — I479 Paroxysmal tachycardia, unspecified: Secondary | ICD-10-CM | POA: Insufficient documentation

## 2023-05-01 NOTE — Progress Notes (Unsigned)
Cardiology Office Note  Date:  05/02/2023   ID:  Henry Garcia, DOB Mar 21, 1996, MRN [id number]  PCP:  [facility]   Chief Complaint  Patient presents with   New Patient (Initial Visit)    Ref by [facility] to established WPW. Patient c/o shortness of breath, chest pain at times, racing/irregular heart beats, lightheaded and weakness. Medications reviewed by the patient verbally.     HPI:  [name] is a 27 year old gentleman with past medical history of Alcohol abuse/daily basis, whiskey daily Depression Atypical chest pain Numerous trips to emergency room Prior workup for shortness of breath at [facility] Childhood asthma Who presents by referral from [facility] clinic for  paroxysmal tachycardia  Prior cardiac workup at [facility], records were requested and reviewed Prior workup for shortness of breath 2020 Symptoms began in April 2019 after a chemical exposure (methylmercaptan).  PFTs negative echo normal EP study for short PR, question WPW, unremarkable, no inducible arrhythmia WPW ruled out  Event monitor "pushing the button while on telemetry and patient was always in sinus rhythm or sinus tachycardia. EKGs with persistently shortened PR interval and question of a delta wave in lead III but these findings could be normal. "  Echo June 2019 and August 2018 EF 50 to 55%, normal RV size and function, overall normal study  For several years symptoms seem to go away  He reports over the past several months, tachycardia and associated symptoms have returned Now with tachycardia daily lasting 2-3 minutes Following tachycardia, feels  weak, tired, lightheaded  Episode of syncope January 18, 2023 seen in the emergency room Reported had been drinking alcohol  more than normal  Reports drinking whiskey on a daily basis  \EKG personally reviewed by myself on todays visit EKG Interpretation Date/Time:  Monday May 02 2023 16:08:29  EDT Ventricular Rate:  90 PR Interval:  96 QRS Duration:  92 QT Interval:  340 QTC Calculation: 415 R Axis:   66  Text Interpretation: Sinus rhythm with short PR When compared with ECG of 18-Jan-2023 18:31, PREVIOUS ECG IS PRESENT Confirmed by Henry Garcia [phone]) on 05/02/2023 4:10:49 PM    PMH:   has a past medical history of Heart murmur and Wolff-Parkinson-White syndrome.  PSH:    Past Surgical History:  Procedure Laterality Date   ELECTROPHYSIOLOGIC STUDY     08/2018   HAND SURGERY Right    5th finger, 2012 ad 2013   TONSILLECTOMY      Current Outpatient Medications  Medication Sig Dispense Refill   hydrOXYzine (ATARAX) 50 MG tablet Take 50 mg by mouth 3 (three) times daily as needed.     Multiple Vitamin (MULTI VITAMIN MENS PO) Take by mouth daily.     No current facility-administered medications for this visit.    Allergies:   Cortisone, Amoxicillin, and Clarithromycin   Social History:  The patient  reports that he has never smoked. He has never used smokeless tobacco. He reports current alcohol use of about 21.0 standard drinks of alcohol per week. He reports that he does not use drugs.   Family History:   family history includes Hyperlipidemia in his father.    Review of Systems: Review of Systems  Constitutional: Negative.   HENT: Negative.    Respiratory: Negative.    Cardiovascular:  Positive for chest pain and palpitations.  Gastrointestinal: Negative.   Musculoskeletal: Negative.   Neurological: Negative.   Psychiatric/Behavioral: Negative.    All other systems reviewed  and are negative.   PHYSICAL EXAM: VS:  BP 118/60 (BP Location: Right Arm, Patient Position: Sitting, Cuff Size: Normal)   Pulse 90   Ht 5\' 5"  (1.651 m)   Wt 135 lb 6 oz (61.4 kg)   SpO2 98%   BMI 22.53 kg/m  , BMI Body mass index is 22.53 kg/m. GEN: Well nourished, well developed, in no acute distress HEENT: normal Neck: no JVD, carotid bruits, or masses Cardiac: RRR; no  murmurs, rubs, or gallops,no edema  Respiratory:  clear to auscultation bilaterally, normal work of breathing GI: soft, nontender, nondistended, + BS MS: no deformity or atrophy Skin: warm and dry, no rash Neuro:  Strength and sensation are intact Psych: euthymic mood, full affect    Recent Labs: 12/02/2022: ALT 24 01/18/2023: BUN 11; Creatinine, Ser 0.64; Hemoglobin 13.8; Platelets 282; Potassium 3.5; Sodium 139    Lipid Panel No results found for: "CHOL", "HDL", "LDLCALC", "TRIG"    Wt Readings from Last 3 Encounters:  05/02/23 135 lb 6 oz (61.4 kg)  04/03/23 130 lb (59 kg)  02/17/23 130 lb (59 kg)       ASSESSMENT AND PLAN:  Problem List Items Addressed This Visit       Cardiology Problems   Paroxysmal tachycardia (HCC) - Primary   Relevant Orders   EKG 12-Lead (Completed)     Other   Adjustment disorder with mixed disturbance of emotions and conduct   Alcohol dependence (HCC)   Paroxysmal tachycardia Prior review of notes from [facility] indicate no inducible arrhythmia at the time of his EP study.  He was under the impression that ablation had been completed but there is no documentation of this.  No WPW confirmed.  Essentially normal echocardiogram -Prior event monitor detailing sinus rhythm for triggered events -With this in mind, he reports symptoms over the past several months that are new in nature, describes again having paroxysmal tachycardia with associated weakness, dizziness.  Zio monitor has been ordered, recommend he start metoprolol succinate after completion of first week of monitor Unclear if alcohol/whiskey intake daily is contributing  Alcohol abuse Recommend cessation of whiskey This could certainly trigger his events as detailed above  Anxiety Was prescribed hydroxyzine by primary care   Patient was seen in consultation for [facility] and will be referred back to their office for ongoing care of the issues detailed  above   Signed, Henry Garcia, M.D., Ph.D. [facility] [place], [place] [phone]

## 2023-05-02 ENCOUNTER — Ambulatory Visit: Payer: MEDICAID | Attending: Cardiology | Admitting: Cardiovascular Disease

## 2023-05-02 ENCOUNTER — Ambulatory Visit: Payer: MEDICAID | Attending: Cardiology

## 2023-05-02 ENCOUNTER — Encounter: Payer: Self-pay | Admitting: Cardiovascular Disease

## 2023-05-02 VITALS — BP 118/60 | HR 90 | Ht 65.0 in | Wt 135.4 lb

## 2023-05-02 DIAGNOSIS — R42 Dizziness and giddiness: Secondary | ICD-10-CM | POA: Diagnosis not present

## 2023-05-02 DIAGNOSIS — I479 Paroxysmal tachycardia, unspecified: Secondary | ICD-10-CM | POA: Diagnosis not present

## 2023-05-02 DIAGNOSIS — F1029 Alcohol dependence with unspecified alcohol-induced disorder: Secondary | ICD-10-CM | POA: Diagnosis not present

## 2023-05-02 DIAGNOSIS — F4325 Adjustment disorder with mixed disturbance of emotions and conduct: Secondary | ICD-10-CM | POA: Diagnosis not present

## 2023-05-02 MED ORDER — METOPROLOL SUCCINATE ER 25 MG PO TB24: 25.0000 mg | ORAL_TABLET | Freq: Every day | ORAL | 3 refills | Status: DC

## 2023-05-02 NOTE — Patient Instructions (Addendum)
Medication Instructions:  In week two of the monitor, Start metoprolol succinate 25 mg daily  If you need a refill on your cardiac medications before your next appointment, please call your pharmacy.   Lab work: No new labs needed  Testing/Procedures: ZIO XT- Long Term Monitor Instructions  Your physician has requested you wear a ZIO patch monitor for 14 days.  This is a single patch monitor. [employer] supplies one patch monitor per enrollment. Additional stickers are not available. Please do not apply patch if you will be having a Nuclear Stress Test,  Echocardiogram, Cardiac CT, MRI, or Chest Xray during the period you would be wearing the  monitor. The patch cannot be worn during these tests. You cannot remove and re-apply the  ZIO XT patch monitor.  Your ZIO patch monitor will be mailed 3 day USPS to your address on file. It may take 3-5 days  to receive your monitor after you have been enrolled.  Once you have received your monitor, please review the enclosed instructions. Your monitor  has already been registered assigning a specific monitor serial # to you.  Billing and Patient Assistance Program Information  We have supplied [employer] with any of your insurance information on file for billing purposes. [employer] offers a sliding scale Patient Assistance Program for patients that do not have  insurance, or whose insurance does not completely cover the cost of the ZIO monitor.  You must apply for the Patient Assistance Program to qualify for this discounted rate.  To apply, please call [employer] at [phone], select option 4, select option 2, ask to apply for  Patient Assistance Program. Meredeth Ide will ask your household income, and how many people  are in your household. They will quote your out-of-pocket cost based on that information.  [employer] will also be able to set up a 66-month, interest-free payment plan if needed.  Applying the monitor   Shave hair from upper left chest.   Hold abrader disc by orange tab. Rub abrader in 40 strokes over the upper left chest as  indicated in your monitor instructions.  Clean area with 4 enclosed alcohol pads. Let dry.  Apply patch as indicated in monitor instructions. Patch will be placed under collarbone on left  side of chest with arrow pointing upward.  Rub patch adhesive wings for 2 minutes. Remove white label marked "1". Remove the white  label marked "2". Rub patch adhesive wings for 2 additional minutes.  While looking in a mirror, press and release button in center of patch. A small green light will  flash 3-4 times. This will be your only indicator that the monitor has been turned on.  Do not shower for the first 24 hours. You may shower after the first 24 hours.  Press the button if you feel a symptom. You will hear a small click. Record Date, Time and  Symptom in the Patient Logbook.  When you are ready to remove the patch, follow instructions on the last 2 pages of Patient  Logbook. Stick patch monitor onto the last page of Patient Logbook.  Place Patient Logbook in the blue and white box. Use locking tab on box and tape box closed  securely. The blue and white box has prepaid postage on it. Please place it in the mailbox as  soon as possible. Your physician should have your test results approximately 7 days after the  monitor has been mailed back to [facility].  Call [facility] Customer Care at [phone] if you  have questions regarding  your ZIO XT patch monitor. Call them immediately if you see an orange light blinking on your  monitor.  If your monitor falls off in less than 4 days, contact our Monitor department at [phone].  If your monitor becomes loose or falls off after 4 days call [employer] at [phone] for  suggestions on securing your monitor   Follow-Up: At [facility], you and your health needs are our priority.  As part of our continuing mission to provide you with exceptional  heart care, we have created designated Provider Care Teams.  These Care Teams include your primary Cardiologist (physician) and Advanced Practice Providers (APPs -  Physician Assistants and Nurse Practitioners) who all work together to provide you with the care you need, when you need it.  You will need a follow up appointment as needed  Providers on your designated Care Team:   Nicolasa Ducking, NP Eula Listen, PA-C Cadence Fransico Michael, [place]  COVID-19 Vaccine Information can be found at: [email or web address] For questions related to vaccine distribution or appointments, please email vaccine@[email or web address] or call [phone].

## 2023-05-03 ENCOUNTER — Telehealth: Payer: Self-pay | Admitting: Cardiovascular Disease

## 2023-05-03 NOTE — Telephone Encounter (Signed)
Spoke w/ pt. He reports that he dropped his wife off at therapy and went walking around in a store and developed pain in both legs, "on the front of my legs above my knees but below my thighs". Pain started around 4:00 and does not feel much better as of this phone call. Pt is concerned that pain is being caused by a blood clot or his heart. Had lengthy discussion about signs of blood clot and also about claudication and that his sx do not sound cardiac in nature. Pt describes the pain as soreness, but he does not work out and has not picked up anything heavy beside his 30 lb baby. Pt states that he is concerned that he has possibly developed a new cardiac symptom and wants Dr. Windell Hummingbird reassurance that he has not. Advised him that I wll make Dr. Mariah Milling aware of his concerns and will check back w/ him tomorrow morning to see if sx have improved.

## 2023-05-03 NOTE — Telephone Encounter (Signed)
Patient calling to say that he is having pains in his legs. Calling to speak to the nurse. Please advise

## 2023-05-04 ENCOUNTER — Ambulatory Visit: Payer: MEDICAID | Admitting: Cardiology

## 2023-05-04 NOTE — Telephone Encounter (Signed)
Spoke w/ pt.  He reports he is feeling good this am w/ no pain. Asked him to call back w/ any other questions or concerns. He is appreciative of the call.

## 2023-05-05 DIAGNOSIS — I479 Paroxysmal tachycardia, unspecified: Secondary | ICD-10-CM | POA: Diagnosis not present

## 2023-05-10 ENCOUNTER — Telehealth: Payer: Self-pay | Admitting: Cardiovascular Disease

## 2023-05-10 NOTE — Telephone Encounter (Signed)
   Pt c/o of Chest Pain: STAT if active CP, including tightness, pressure, jaw pain, radiating pain to shoulder/upper arm/back, CP unrelieved by Nitro. Symptoms reported of SOB, nausea, vomiting, sweating.  1. Are you having CP right now?   No  2. Are you experiencing any other symptoms (ex. SOB, nausea, vomiting, sweating)?   No  3. Is your CP continuous or coming and going?   Continuous   4. Have you taken Nitroglycerin?   5. How long have you been experiencing CP?   Started this morning about 7:30 am    6. If NO CP at time of call then end call with telling Pt to call back or call 911 if Chest pain returns prior to return call from triage team.   Patient stated he is having tingling in hand and arms which started in his left hand and arm and moved over to his right hand and arm.  Patient wants a call back to discuss if this could be heart related.

## 2023-05-10 NOTE — Telephone Encounter (Signed)
Pt called, reports: I was sleeping and woke at 0730 and noticed both arms and hands tingling, right extremity is better but left still has some tingling  Pt denies rapid breathing, or circulation compromise ("not sleeping on the arms - just tossing and turning"), pt denies HA/N/V/speaking/vision difficulties, or pain associated with concern  Pt is 5 days into Zio monitor, reports he pressed the button and recorded what was happening in the journal, pt instructed on how to calculate HR, to hydrate, and several methods to calm self and reduce HR if it's elevated  Pt advised if concern continues to call back, or to call 911 if he experiences an emergency

## 2023-05-16 ENCOUNTER — Emergency Department
Admission: EM | Admit: 2023-05-16 | Discharge: 2023-05-16 | Disposition: A | Discharge status: Home / Self Care | Payer: MEDICAID | Attending: Emergency Medicine | Admitting: Emergency Medicine

## 2023-05-16 ENCOUNTER — Other Ambulatory Visit: Payer: Self-pay

## 2023-05-16 ENCOUNTER — Emergency Department: Payer: MEDICAID

## 2023-05-16 DIAGNOSIS — R079 Chest pain, unspecified: Secondary | ICD-10-CM

## 2023-05-16 DIAGNOSIS — R002 Palpitations: Secondary | ICD-10-CM | POA: Diagnosis present

## 2023-05-16 LAB — CBC
HCT: 39.3 % (ref 39.0–52.0)
Hemoglobin: 13.9 g/dL (ref 13.0–17.0)
MCH: 30.7 pg (ref 26.0–34.0)
MCHC: 35.4 g/dL (ref 30.0–36.0)
MCV: 86.8 fL (ref 80.0–100.0)
Platelets: 257 10*3/uL (ref 150–400)
RBC: 4.53 MIL/uL (ref 4.22–5.81)
RDW: 12.3 % (ref 11.5–15.5)
WBC: 5.5 10*3/uL (ref 4.0–10.5)
nRBC: 0 % (ref 0.0–0.2)

## 2023-05-16 LAB — BASIC METABOLIC PANEL
Anion gap: 9 (ref 5–15)
BUN: 13 mg/dL (ref 6–20)
CO2: 26 mmol/L (ref 22–32)
Calcium: 9.1 mg/dL (ref 8.9–10.3)
Chloride: 101 mmol/L (ref 98–111)
Creatinine, Ser: 0.67 mg/dL (ref 0.61–1.24)
GFR, Estimated: >60 mL/min (ref >60)
Glucose, Bld: 114 mg/dL — ABNORMAL HIGH (ref 70–99)
Potassium: 3.5 mmol/L (ref 3.5–5.1)
Sodium: 136 mmol/L (ref 135–145)

## 2023-05-16 LAB — TROPONIN I (HIGH SENSITIVITY)
Troponin I (High Sensitivity): <2 ng/L (ref <18)
Troponin I (High Sensitivity): <2 ng/L (ref <18)

## 2023-05-16 NOTE — ED Provider Notes (Signed)
[facility] Provider Note    Event Date/Time   First MD Initiated Contact with Patient 05/16/23 2102     (approximate)  History   Chief Complaint: Chest Pain  HPI  Henry Garcia is a 27 y.o. male with a past medical history of Wolff-Parkinson-White syndrome, heart murmur presents to the emergency department for palpitations and chest tightness.  I reviewed the patient's care everywhere chart from [facility].  Patient in 2020 did have an ablation for possible Wolff-Parkinson-White however notes following this read that they believe the patient likely did not have Wolff-Parkinson-White and could not find a cardiac reason for the patient's symptoms.  In reading the notes patient has been seen by cardiology as well as pulmonology with no clear cause for the patient's symptoms.  Patient states he has been experiencing symptoms ever since then however he feels recently they have been getting worse.  His symptoms he describes are palpitations feeling like his heart is racing at times as well as some chest tightness.  Currently vital signs are reassuring pulse rate in the 70s.  Patient is currently wearing a Zio patch x 1 week so far.  Physical Exam   Triage Vital Signs: ED Triage Vitals [05/16/23 1638]  Encounter Vitals Group     BP 137/78     Systolic BP Percentile      Diastolic BP Percentile      Pulse Rate 94     Resp 18     Temp 98.3 F (36.8 C)     Temp Source Oral     SpO2 99 %     Weight 136 lb (61.7 kg)     Height 5\' 5"  (1.651 m)     Head Circumference      Peak Flow      Pain Score 0     Pain Loc      Pain Education      Exclude from Growth Chart     Most recent vital signs: Vitals:   05/16/23 1638 05/16/23 2112  BP: 137/78 (!) 140/92  Pulse: 94 75  Resp: 18 18  Temp: 98.3 F (36.8 C) 98 F (36.7 C)  SpO2: 99% 100%    General: Awake, no distress.  CV:  Good peripheral perfusion.  Regular rate and rhythm  Resp:  Normal effort.  Equal breath  sounds bilaterally.  Abd:  No distention.  Soft, nontender.  No rebound or guarding.  ED Results / Procedures / Treatments   EKG  EKG viewed and interpreted by myself shows a sinus rhythm at 83 bpm with a narrow QRS, normal axis, normal intervals, no concerning ST changes.  RADIOLOGY  I reviewed and interpreted chest x-ray images.  No consolidation on my evaluation. Radiologist read the chest x-ray is negative.   MEDICATIONS ORDERED IN ED: Medications - No data to display   IMPRESSION / MDM / ASSESSMENT AND PLAN / ED COURSE  I reviewed the triage vital signs and the nursing notes.  Patient's presentation is most consistent with acute presentation with potential threat to life or bodily function.  Patient presents the emergency department for intermittent palpitations shortness of breath and chest tightness.  In reading the patient's notes this appears to have been ongoing for quite some time, did follow-up with [facility] had an ablation for possible Wolff-Parkinson-White but then ultimately thought that his symptoms were likely not Wolff-Parkinson-White related and unlikely to be cardiac related.  Patient currently has a Zio patch in place.  His workup so far in the emergency department is reassuring, vital signs are reassuring with a normal pulse rate in the 70s, CBC is normal pulm normal white blood cell count, chemistry is normal and troponin is negative.  We will repeat a troponin as a precaution.  EKG shows no concerning findings and chest x-ray is clear.  If the patient's repeat troponin remains negative anticipate likely discharge home with PCP follow-up for continued workup.  Patient's repeat troponin remains negative.  Given the patient's reassuring workup I believe the patient is safe for discharge home from an emergent standpoint to follow-up with his PCP.  Patient agreeable to plan of care.  FINAL CLINICAL IMPRESSION(S) / ED DIAGNOSES   Palpitations   Note:  This document was  prepared using Dragon voice recognition software and may include unintentional dictation errors.   Minna Antis, MD 05/16/23 2231

## 2023-05-16 NOTE — ED Triage Notes (Signed)
Pt in via POV c/o pounding HR and Weakness since 2am this morning. Pt is wearing a Holter monitor due to repeat if the above symptoms. Pt has been wearing the Holter monitor since 10/17.

## 2023-05-16 NOTE — ED Notes (Signed)
This RN informed provider of neuro assessment and concern for difference of weakness and sensation from left to right side of body

## 2023-05-26 ENCOUNTER — Ambulatory Visit: Payer: MEDICAID | Attending: Nurse Practitioner | Admitting: Cardiology

## 2023-05-26 ENCOUNTER — Encounter: Payer: Self-pay | Admitting: Cardiology

## 2023-05-26 VITALS — BP 100/64 | HR 84 | Ht 65.0 in | Wt 135.4 lb

## 2023-05-26 DIAGNOSIS — I479 Paroxysmal tachycardia, unspecified: Secondary | ICD-10-CM | POA: Diagnosis not present

## 2023-05-26 NOTE — Progress Notes (Signed)
Cardiology Office Note Date:  05/26/2023  Patient ID:  Henry Garcia, Henry Garcia 20-Dec-1995, MRN [id number] PCP:  [facility]  Cardiologist:  Julien Nordmann, MD Electrophysiologist: None    Chief Complaint: ER follow-up  History of Present Illness: Henry Garcia is a 27 y.o. male with PMH notable for parox tachycardia, pre-excitation, ETOH abuse, anxiety; seen today for ER follow up.   He last saw Dr. Mariah Milling 04/2023 for consult from PCP for parox tachycardia. He has had extensive eval of his pre-excitation at [facility], including EP study for which there was no inducible SVT or accessory pathway identified. Dr. Mariah Milling started low-dose BB for tachycardia and zio. He presented to ER 10/28 with increased palpitations and chest tightness, zio in place. Trops negative, cxr clear, labs stable.   On follow-up today, he has continued to have palpitation episodes, but they are brief and less symptomatic that the one that prompted the ER visit.  He is working with a therapist to reduce his ETOH usage. He has gone up to 4 days without a drink, and 2 days without a drink. Of note, his recent ER visit was during his 2 day stretch of sobriety.  He has continued to take toprol 25mg  daily, has not noticed any change in symptoms.   Currently, he denies chest pain, palpitations, dyspnea, PND, orthopnea, nausea, vomiting, dizziness, syncope, edema, weight gain, or early satiety.     AAD History: None   Past Medical History:  Diagnosis Date   Heart murmur    Heart murmur    Wolff-Parkinson-White syndrome     Past Surgical History:  Procedure Laterality Date   ELECTROPHYSIOLOGIC STUDY     08/2018   HAND SURGERY Right    5th finger, 2012 ad 2013   TONSILLECTOMY      Current Outpatient Medications  Medication Instructions   metoprolol succinate (TOPROL-XL) 25 mg, Oral, Daily, Take with or immediately following a meal.   Multiple Vitamin (MULTI VITAMIN MENS PO) Oral, Daily     Social History:  The patient  reports that he has never smoked. He has never used smokeless tobacco. He reports current alcohol use of about 21.0 standard drinks of alcohol per week. He reports that he does not use drugs.   Family History:   The patient's family history includes Hyperlipidemia in his father.  ROS:  Please see the history of present illness. All other systems are reviewed and otherwise negative.   PHYSICAL EXAM:  VS:  BP 100/64 (BP Location: Left Arm, Patient Position: Sitting, Cuff Size: Normal)   Pulse 84   Ht 5\' 5"  (1.651 m)   Wt 135 lb 7 oz (61.4 kg)   SpO2 99%   BMI 22.54 kg/m  BMI: Body mass index is 22.54 kg/m.  GEN- The patient is well appearing, alert and oriented x 3 today.   Lungs- Clear to ausculation bilaterally, normal work of breathing.  Heart- Regular rate and rhythm, no murmurs, rubs or gallops Extremities- No peripheral edema, warm, dry   EKG is ordered. Personal review of EKG from today shows:    EKG Interpretation Date/Time:  Thursday May 26 2023 14:37:17 EST Ventricular Rate:  84 PR Interval:  114 QRS Duration:  92 QT Interval:  344 QTC Calculation: 406 R Axis:   60  Text Interpretation: Normal sinus rhythm Normal ECG When compared with ECG of 16-May-2023 16:38, No significant change was found Confirmed by Sherie Don [phone]) on 05/26/2023 2:39:27 PM    05/02/2023: SR,  short PR, rate 90 PR - 96  Recent Labs: 12/02/2022: ALT 24 05/16/2023: BUN 13; Creatinine, Ser 0.67; Hemoglobin 13.9; Platelets 257; Potassium 3.5; Sodium 136  No results found for requested labs within last 365 days.   Estimated Creatinine Clearance: 119.4 mL/min (by C-G formula based on SCr of 0.67 mg/dL).   Wt Readings from Last 3 Encounters:  05/26/23 135 lb 7 oz (61.4 kg)  05/16/23 136 lb (61.7 kg)  05/02/23 135 lb 6 oz (61.4 kg)     Additional studies reviewed include: Previous EP, cardiology notes.   EP Study, 08/18/2018 (at [facility] via care  everywhere) Findings: Comprehensive electrophysiology study with no inducible SVT. No evidence of accessory pathway / bypass tract   Cardiac event monitor, 02/28/2018 Analysis time: 13 days, 23 hours  Patient had a min HR of 50 bpm, max HR of 192 bpm, and avg HR of 90 bpm.  Predominant underlying rhythm was Sinus Rhythm.  Isolated SVEs were rare (<1.0%), SVE Couplets were rare (<1.0%), and no SVE Triplets were present.  Isolated VEs were rare (<1.0%), and no VE Couplets or VE Triplets were present.  There were no patient triggered events or diary entries.   TTE, 01/12/2018  Technically difficult study.   Normal global left ventricular size and wall thickness.   No obvious regional wall motion abnormalities.   Borderline normal left ventricular ejection fraction estimated at 50-55%.   Septal E/E' ratio is 6.3 indicating normal filling pressure.   Right ventricle appears normal in size and function.   No significant valve abnormalities.   IVC appears dilated (2.3 cm) does not collapse, indicating high right-sided pressures.    ASSESSMENT AND PLAN:  #) palpitations #) short PR interval  #) anxiety #) ETOH abuse Patient previously had EPS without inducible SVT or evidenceof accessory pathway or bypass tract.  Patient has had an increase in palpitation episodes associated with lightheadedness and chest discomfort. Recent ER visit unrevealing for cardiac cause of symptoms He recently completed 2 week zio, results unavailable at this time Will follow Zio results Consider treadmill stress if zio unrevealing No plans for repeat EPS at this time Continue 25mg  toprol daily Unclear whether patient's ETOH usage is contributing to his palpitation episodes. Congratulated on reduction in use. Encouraged cessation. Recommended ongoing anxiety mgmt with PCP and/or therapist  Discussed with Dr. Jimmey Ralph        Current medicines are reviewed at length with the patient today.   The patient has  concerns regarding his medicines.  The following changes were made today:  none  Labs/ tests ordered today include:  Orders Placed This Encounter  Procedures   EKG 12-Lead     Disposition: Follow up with EP APP PRN, pending zio results    Signed, Sherie Don, NP  05/26/23  3:28 PM  Electrophysiology [facility]

## 2023-05-26 NOTE — Patient Instructions (Signed)
Medication Instructions:  The current medical regimen is effective;  continue present plan and medications.  *If you need a refill on your cardiac medications before your next appointment, please call your pharmacy*   Follow-Up: At [facility], you and your health needs are our priority.  As part of our continuing mission to provide you with exceptional heart care, we have created designated Provider Care Teams.  These Care Teams include your primary Cardiologist (physician) and Advanced Practice Providers (APPs -  Physician Assistants and Nurse Practitioners) who all work together to provide you with the care you need, when you need it.  We recommend signing up for the patient portal called "MyChart".  Sign up information is provided on this After Visit Summary.  MyChart is used to connect with patients for Virtual Visits (Telemedicine).  Patients are able to view lab/test results, encounter notes, upcoming appointments, etc.  Non-urgent messages can be sent to your provider as well.   To learn more about what you can do with MyChart, go to [email or web address].    Your next appointment:   We will call you when we receive your monitor results.

## 2023-05-30 ENCOUNTER — Encounter: Payer: Self-pay | Admitting: Cardiology

## 2023-05-31 ENCOUNTER — Telehealth: Payer: Self-pay | Admitting: Cardiovascular Disease

## 2023-05-31 NOTE — Telephone Encounter (Signed)
Called and spoke with patient. Informed him that Dr. Mariah Milling has not had a chance to review the heart monitor results. Informed patient that once Dr. Mariah Milling has reviewed the results that we would contact him with the results. Patient verbalizes understanding.

## 2023-05-31 NOTE — Telephone Encounter (Signed)
Patient called to follow-up on his heart monitor test results.

## 2023-06-08 ENCOUNTER — Telehealth: Payer: Self-pay | Admitting: Cardiovascular Disease

## 2023-06-08 NOTE — Telephone Encounter (Signed)
Pt would like a c/b for monitor results

## 2023-06-08 NOTE — Telephone Encounter (Signed)
Spoke with Pt. Told him the Dr has not had a chance to review the results yet and we would give him a call once we had an answer. Asked if I could forward to the Dr to see if he could get results soon. Will forward to Dr Mariah Milling and his nurse.

## 2023-06-13 NOTE — Telephone Encounter (Signed)
See result note.  

## 2023-06-26 ENCOUNTER — Encounter: Payer: Self-pay | Admitting: Emergency Medicine

## 2023-06-26 DIAGNOSIS — F10220 Alcohol dependence with intoxication, uncomplicated: Secondary | ICD-10-CM | POA: Diagnosis not present

## 2023-06-26 DIAGNOSIS — F4325 Adjustment disorder with mixed disturbance of emotions and conduct: Secondary | ICD-10-CM | POA: Diagnosis present

## 2023-06-26 DIAGNOSIS — Y907 Blood alcohol level of 200-239 mg/100 ml: Secondary | ICD-10-CM | POA: Insufficient documentation

## 2023-06-26 DIAGNOSIS — R079 Chest pain, unspecified: Secondary | ICD-10-CM | POA: Diagnosis not present

## 2023-06-26 DIAGNOSIS — F332 Major depressive disorder, recurrent severe without psychotic features: Secondary | ICD-10-CM | POA: Diagnosis not present

## 2023-06-26 DIAGNOSIS — F1914 Other psychoactive substance abuse with psychoactive substance-induced mood disorder: Secondary | ICD-10-CM | POA: Diagnosis not present

## 2023-06-26 LAB — CBC WITH DIFFERENTIAL/PLATELET
Abs Immature Granulocytes: 0.01 10*3/uL (ref 0.00–0.07)
Basophils Absolute: 0 10*3/uL (ref 0.0–0.1)
Basophils Relative: 1 %
Eosinophils Absolute: 0 10*3/uL (ref 0.0–0.5)
Eosinophils Relative: 1 %
HCT: 40.3 % (ref 39.0–52.0)
Hemoglobin: 14 g/dL (ref 13.0–17.0)
Immature Granulocytes: 0 %
Lymphocytes Relative: 24 %
Lymphs Abs: 1.6 10*3/uL (ref 0.7–4.0)
MCH: 30.5 pg (ref 26.0–34.0)
MCHC: 34.7 g/dL (ref 30.0–36.0)
MCV: 87.8 fL (ref 80.0–100.0)
Monocytes Absolute: 0.6 10*3/uL (ref 0.1–1.0)
Monocytes Relative: 9 %
Neutro Abs: 4.4 10*3/uL (ref 1.7–7.7)
Neutrophils Relative %: 65 %
Platelets: 289 10*3/uL (ref 150–400)
RBC: 4.59 MIL/uL (ref 4.22–5.81)
RDW: 12.3 % (ref 11.5–15.5)
WBC: 6.7 10*3/uL (ref 4.0–10.5)
nRBC: 0 % (ref 0.0–0.2)

## 2023-06-26 LAB — COMPREHENSIVE METABOLIC PANEL
ALT: 19 U/L (ref 0–44)
AST: 24 U/L (ref 15–41)
Albumin: 5 g/dL (ref 3.5–5.0)
Alkaline Phosphatase: 76 U/L (ref 38–126)
Anion gap: 12 (ref 5–15)
BUN: 11 mg/dL (ref 6–20)
CO2: 22 mmol/L (ref 22–32)
Calcium: 8.9 mg/dL (ref 8.9–10.3)
Chloride: 100 mmol/L (ref 98–111)
Creatinine, Ser: 0.63 mg/dL (ref 0.61–1.24)
GFR, Estimated: >60 mL/min (ref >60)
Glucose, Bld: 89 mg/dL (ref 70–99)
Potassium: 3.6 mmol/L (ref 3.5–5.1)
Sodium: 134 mmol/L — ABNORMAL LOW (ref 135–145)
Total Bilirubin: 0.7 mg/dL (ref <1.2)
Total Protein: 8.4 g/dL — ABNORMAL HIGH (ref 6.5–8.1)

## 2023-06-26 LAB — ETHANOL: Alcohol, Ethyl (B): 218 mg/dL — ABNORMAL HIGH (ref <10)

## 2023-06-26 LAB — URINE DRUG SCREEN, QUALITATIVE (ARMC ONLY)
Amphetamines, Ur Screen: NOT DETECTED
Barbiturates, Ur Screen: NOT DETECTED
Benzodiazepine, Ur Scrn: NOT DETECTED
Cannabinoid 50 Ng, Ur ~~LOC~~: NOT DETECTED
Cocaine Metabolite,Ur ~~LOC~~: NOT DETECTED
MDMA (Ecstasy)Ur Screen: NOT DETECTED
Methadone Scn, Ur: NOT DETECTED
Opiate, Ur Screen: NOT DETECTED
Phencyclidine (PCP) Ur S: NOT DETECTED
Tricyclic, Ur Screen: NOT DETECTED

## 2023-06-26 NOTE — ED Triage Notes (Signed)
Pt here being IVC'd for having thoughts of suicide off and on per pt , , NO HI  , pt admits to drinking today but no drugs

## 2023-06-27 ENCOUNTER — Inpatient Hospital Stay
Admission: RE | Admit: 2023-06-27 | Discharge: 2023-06-30 | DRG: 882 | Disposition: A | Discharge status: Home / Self Care | Payer: MEDICAID | Source: Intra-hospital | Attending: Psychiatry | Admitting: Psychiatry

## 2023-06-27 ENCOUNTER — Other Ambulatory Visit: Payer: Self-pay

## 2023-06-27 ENCOUNTER — Encounter: Payer: Self-pay | Admitting: Psychiatric/Mental Health

## 2023-06-27 ENCOUNTER — Emergency Department
Admission: EM | Admit: 2023-06-27 | Discharge: 2023-06-27 | Disposition: A | Discharge status: Other Acute Inpatient Hospital | Payer: MEDICAID | Attending: Emergency Medicine | Admitting: Emergency Medicine

## 2023-06-27 DIAGNOSIS — F10229 Alcohol dependence with intoxication, unspecified: Secondary | ICD-10-CM | POA: Diagnosis present

## 2023-06-27 DIAGNOSIS — F332 Major depressive disorder, recurrent severe without psychotic features: Secondary | ICD-10-CM | POA: Diagnosis present

## 2023-06-27 DIAGNOSIS — Y907 Blood alcohol level of 200-239 mg/100 ml: Secondary | ICD-10-CM | POA: Diagnosis present

## 2023-06-27 DIAGNOSIS — Z9151 Personal history of suicidal behavior: Secondary | ICD-10-CM

## 2023-06-27 DIAGNOSIS — Z881 Allergy status to other antibiotic agents status: Secondary | ICD-10-CM | POA: Diagnosis not present

## 2023-06-27 DIAGNOSIS — F1020 Alcohol dependence, uncomplicated: Secondary | ICD-10-CM | POA: Diagnosis present

## 2023-06-27 DIAGNOSIS — R45851 Suicidal ideations: Secondary | ICD-10-CM | POA: Diagnosis present

## 2023-06-27 DIAGNOSIS — F4325 Adjustment disorder with mixed disturbance of emotions and conduct: Principal | ICD-10-CM | POA: Diagnosis present

## 2023-06-27 DIAGNOSIS — Z79899 Other long term (current) drug therapy: Secondary | ICD-10-CM | POA: Diagnosis not present

## 2023-06-27 DIAGNOSIS — F419 Anxiety disorder, unspecified: Secondary | ICD-10-CM | POA: Diagnosis present

## 2023-06-27 DIAGNOSIS — F1021 Alcohol dependence, in remission: Secondary | ICD-10-CM | POA: Diagnosis present

## 2023-06-27 DIAGNOSIS — F1024 Alcohol dependence with alcohol-induced mood disorder: Secondary | ICD-10-CM | POA: Diagnosis not present

## 2023-06-27 DIAGNOSIS — F1994 Other psychoactive substance use, unspecified with psychoactive substance-induced mood disorder: Secondary | ICD-10-CM

## 2023-06-27 DIAGNOSIS — Z888 Allergy status to other drugs, medicaments and biological substances status: Secondary | ICD-10-CM | POA: Diagnosis not present

## 2023-06-27 DIAGNOSIS — Z880 Allergy status to penicillin: Secondary | ICD-10-CM | POA: Diagnosis not present

## 2023-06-27 DIAGNOSIS — F1094 Alcohol use, unspecified with alcohol-induced mood disorder: Secondary | ICD-10-CM | POA: Diagnosis present

## 2023-06-27 DIAGNOSIS — F10929 Alcohol use, unspecified with intoxication, unspecified: Secondary | ICD-10-CM

## 2023-06-27 LAB — ACETAMINOPHEN LEVEL: Acetaminophen (Tylenol), Serum: <10 ug/mL — ABNORMAL LOW (ref 10–30)

## 2023-06-27 LAB — SALICYLATE LEVEL: Salicylate Lvl: <7 mg/dL — ABNORMAL LOW (ref 7.0–30.0)

## 2023-06-27 MED ORDER — HYDROXYZINE HCL 25 MG PO TABS: 25.0000 mg | ORAL_TABLET | Freq: Four times a day (QID) | ORAL | Status: AC | PRN

## 2023-06-27 MED ORDER — LORAZEPAM 2 MG/ML IJ SOLN: 2.0000 mg | Freq: Three times a day (TID) | INTRAMUSCULAR | Status: DC | PRN

## 2023-06-27 MED ORDER — DIPHENHYDRAMINE HCL 50 MG/ML IJ SOLN: 50.0000 mg | Freq: Three times a day (TID) | INTRAMUSCULAR | Status: DC | PRN

## 2023-06-27 MED ORDER — TRAZODONE HCL 50 MG PO TABS: 50.0000 mg | ORAL_TABLET | Freq: Every evening | ORAL | Status: DC | PRN

## 2023-06-27 MED ORDER — METOPROLOL SUCCINATE ER 25 MG PO TB24
25.0000 mg | ORAL_TABLET | Freq: Every day | ORAL | Status: DC
  Administered 2023-06-28 – 2023-06-30 (×3): 25 mg via ORAL
  Filled 2023-06-27 (×5): qty 1

## 2023-06-27 MED ORDER — HALOPERIDOL LACTATE 5 MG/ML IJ SOLN: 5.0000 mg | Freq: Three times a day (TID) | INTRAMUSCULAR | Status: DC | PRN

## 2023-06-27 MED ORDER — HALOPERIDOL LACTATE 5 MG/ML IJ SOLN: 10.0000 mg | Freq: Three times a day (TID) | INTRAMUSCULAR | Status: DC | PRN

## 2023-06-27 MED ORDER — CHLORDIAZEPOXIDE HCL 25 MG PO CAPS: 50.0000 mg | ORAL_CAPSULE | Freq: Once | ORAL | Status: DC

## 2023-06-27 MED ORDER — ADULT MULTIVITAMIN W/MINERALS CH
1.0000 | ORAL_TABLET | Freq: Every day | ORAL | Status: DC
  Administered 2023-06-27 – 2023-06-30 (×4): 1 via ORAL
  Filled 2023-06-27 (×4): qty 1

## 2023-06-27 MED ORDER — MAGNESIUM HYDROXIDE 400 MG/5ML PO SUSP: 30.0000 mL | Freq: Every day | ORAL | Status: DC | PRN

## 2023-06-27 MED ORDER — LOPERAMIDE HCL 2 MG PO CAPS
2.0000 mg | ORAL_CAPSULE | ORAL | Status: AC | PRN
  Administered 2023-06-27 – 2023-06-28 (×2): 2 mg via ORAL
  Administered 2023-06-29: 4 mg via ORAL
  Filled 2023-06-27 (×2): qty 1
  Filled 2023-06-27: qty 2

## 2023-06-27 MED ORDER — HALOPERIDOL 5 MG PO TABS: 5.0000 mg | ORAL_TABLET | Freq: Three times a day (TID) | ORAL | Status: DC | PRN

## 2023-06-27 MED ORDER — ALUM & MAG HYDROXIDE-SIMETH 200-200-20 MG/5ML PO SUSP: 30.0000 mL | ORAL | Status: DC | PRN

## 2023-06-27 MED ORDER — HYDROXYZINE HCL 25 MG PO TABS: 25.0000 mg | ORAL_TABLET | Freq: Three times a day (TID) | ORAL | Status: DC | PRN

## 2023-06-27 MED ORDER — CHLORDIAZEPOXIDE HCL 25 MG PO CAPS
25.0000 mg | ORAL_CAPSULE | ORAL | Status: DC
  Administered 2023-06-30: 25 mg via ORAL
  Filled 2023-06-27: qty 1

## 2023-06-27 MED ORDER — CHLORDIAZEPOXIDE HCL 25 MG PO CAPS
25.0000 mg | ORAL_CAPSULE | Freq: Four times a day (QID) | ORAL | Status: AC
  Administered 2023-06-28 (×3): 25 mg via ORAL
  Filled 2023-06-27 (×3): qty 1

## 2023-06-27 MED ORDER — CHLORDIAZEPOXIDE HCL 25 MG PO CAPS: 25.0000 mg | ORAL_CAPSULE | Freq: Four times a day (QID) | ORAL | Status: AC | PRN

## 2023-06-27 MED ORDER — DIPHENHYDRAMINE HCL 25 MG PO CAPS: 50.0000 mg | ORAL_CAPSULE | Freq: Three times a day (TID) | ORAL | Status: DC | PRN

## 2023-06-27 MED ORDER — CHLORDIAZEPOXIDE HCL 25 MG PO CAPS
25.0000 mg | ORAL_CAPSULE | Freq: Three times a day (TID) | ORAL | Status: AC
Start: 2023-06-29 — End: 2023-06-30
  Administered 2023-06-29 (×3): 25 mg via ORAL
  Filled 2023-06-27 (×3): qty 1

## 2023-06-27 MED ORDER — CHLORDIAZEPOXIDE HCL 25 MG PO CAPS: 25.0000 mg | ORAL_CAPSULE | Freq: Every day | ORAL | Status: DC

## 2023-06-27 MED ORDER — THIAMINE HCL 100 MG/ML IJ SOLN: 100.0000 mg | Freq: Once | INTRAMUSCULAR | Status: DC

## 2023-06-27 MED ORDER — ACETAMINOPHEN 325 MG PO TABS: 650.0000 mg | ORAL_TABLET | Freq: Four times a day (QID) | ORAL | Status: DC | PRN

## 2023-06-27 MED ORDER — ONDANSETRON 4 MG PO TBDP: 4.0000 mg | ORAL_TABLET | Freq: Four times a day (QID) | ORAL | Status: AC | PRN

## 2023-06-27 NOTE — ED Notes (Signed)
3 legal documents scanned and e-filed

## 2023-06-27 NOTE — Progress Notes (Signed)
Patient does not want to have any communication with his mother, Nettie Elm.
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
## 2023-06-27 NOTE — Group Note (Signed)
[facility] LCSW Group Therapy Note    Group Date: 06/27/2023 Start Time: 1300 End Time: 1400  Type of Therapy and Topic:  Group Therapy:  Overcoming Obstacles  Participation Level:  [facility] PARTICIPATION LEVEL: None   Description of Group:   In this group patients will be encouraged to explore what they see as obstacles to their own wellness and recovery. They will be guided to discuss their thoughts, feelings, and behaviors related to these obstacles. The group will process together ways to cope with barriers, with attention given to specific choices patients can make. Each patient will be challenged to identify changes they are motivated to make in order to overcome their obstacles. This group will be process-oriented, with patients participating in exploration of their own experiences as well as giving and receiving support and challenge from other group members.  Therapeutic Goals: 1. Patient will identify personal and current obstacles as they relate to admission. 2. Patient will identify barriers that currently interfere with their wellness or overcoming obstacles.  3. Patient will identify feelings, thought process and behaviors related to these barriers. 4. Patient will identify two changes they are willing to make to overcome these obstacles:    Summary of Patient Progress Pt was present for the entirety of the group process. He spoke once during the icebreaker to acknowledge that he does not have a favorite song but did not speak further in the group process. Pt did however appear to attend to the conversation. Insight into the topic or himself is questionable.   Therapeutic Modalities:   Cognitive Behavioral Therapy Solution Focused Therapy Motivational Interviewing Relapse Prevention Therapy   Glenis Smoker, LCSW

## 2023-06-27 NOTE — ED Notes (Signed)
TTS in to speak with patient.

## 2023-06-27 NOTE — Progress Notes (Signed)
Patient continues to refuse scheduled Librium. Earlene Plater, NP is aware.

## 2023-06-27 NOTE — ED Provider Notes (Signed)
[facility] Provider Note    Event Date/Time   First MD Initiated Contact with Patient 06/27/23 0000     (approximate)   History   IVC and Alcohol Problem   HPI Henry Garcia is a 27 y.o. male with prior episodes of chest pain and reportedly with a diagnosis of bipolar disorder.  He presents under involuntary commitment by Patent examiner.  He says he just drank too much tonight and that it got to be a little bit too much so he was claiming to have thoughts of killing himself.  The paperwork indicates that he was telling numerous family and friends and even his child that he did not want to be here anymore and that he was going to kill himself and claiming that he was going to use a knife that is displayed on the wall to harm himself.  He said that that was all just because of the alcohol and he feels better now.  He denies any pain and denies shortness of breath.     Physical Exam   Triage Vital Signs: ED Triage Vitals  Encounter Vitals Group     BP 06/26/23 2225 131/88     Systolic BP Percentile --      Diastolic BP Percentile --      Pulse Rate 06/26/23 2225 83     Resp 06/26/23 2225 16     Temp 06/26/23 2225 97.8 F (36.6 C)     Temp src --      SpO2 06/26/23 2225 98 %     Weight 06/26/23 2226 61.2 kg (135 lb)     Height 06/26/23 2226 1.651 m (5\' 5" )     Head Circumference --      Peak Flow --      Pain Score 06/26/23 2226 0     Pain Loc --      Pain Education --      Exclude from Growth Chart --     Most recent vital signs: Vitals:   06/26/23 2225  BP: 131/88  Pulse: 83  Resp: 16  Temp: 97.8 F (36.6 C)  SpO2: 98%    General: Awake, no distress.  Calm and cooperative.  Smells of alcohol but is speaking clearly and lucidly. CV:  Good peripheral perfusion.  Regular rate and rhythm. Resp:  Normal effort. Speaking easily and comfortably, no accessory muscle usage nor intercostal retractions.   Abd:  No distention.  Other:  Admits  to thoughts of suicide but said it is just alcohol, denies any current thoughts of suicide   ED Results / Procedures / Treatments   Labs (all labs ordered are listed, but only abnormal results are displayed) Labs Reviewed  COMPREHENSIVE METABOLIC PANEL - Abnormal; Notable for the following components:      Result Value   Sodium 134 (*)    Total Protein 8.4 (*)    All other components within normal limits  ETHANOL - Abnormal; Notable for the following components:   Alcohol, Ethyl (B) 218 (*)    All other components within normal limits  ACETAMINOPHEN LEVEL - Abnormal; Notable for the following components:   Acetaminophen (Tylenol), Serum <10 (*)    All other components within normal limits  SALICYLATE LEVEL - Abnormal; Notable for the following components:   Salicylate Lvl <7.0 (*)    All other components within normal limits  URINE DRUG SCREEN, QUALITATIVE ([facility] ONLY)  CBC WITH DIFFERENTIAL/PLATELET      PROCEDURES:  Critical Care performed: No  Procedures    IMPRESSION / MDM / ASSESSMENT AND PLAN / ED COURSE  I reviewed the triage vital signs and the nursing notes.                              Differential diagnosis includes, but is not limited to, substance-induced mood disorder, adjustment disorder, depression, bipolar disorder.  Patient's presentation is most consistent with acute presentation with potential threat to life or bodily function.  Labs/studies ordered: Urine drug screen, ethanol level, CMP, CBC with differential, salicylate level, acetaminophen level  Interventions/Medications given:  Medications - No data to display  (Note:  hospital course my include additional interventions and/or labs/studies not listed above.)   Very likely substance-induced mood disorder given the patient's high level of intoxication, however reportedly has a history of bipolar disorder as well.  He is acting very appropriate at this time but still is clinically  intoxicated.  We will observe him overnight.  No point in ordering TTS or psych consult at this time since he is still intoxicated, I will reassess him in the morning or have a psych consult in the morning after he is clinically sober.  Patient is medically cleared other than the need to metabolize his alcohol.     Clinical Course as of 06/27/23 0521  Mon Jun 27, 2023  4132 Patient has been evaluated by psychiatry and they are recommending inpatient treatment. [CF]    Clinical Course User Index [CF] Loleta Rose, MD     FINAL CLINICAL IMPRESSION(S) / ED DIAGNOSES   Final diagnoses:  Alcoholic intoxication with complication (HCC)  Substance induced mood disorder (HCC)     Rx / DC Orders   ED Discharge Orders     None        Note:  This document was prepared using Dragon voice recognition software and may include unintentional dictation errors.   Loleta Rose, MD 06/27/23 [phone]

## 2023-06-27 NOTE — Group Note (Signed)
Date:  06/27/2023 Time:  5:04 PM  Group Topic/Focus:  Activity Group: The focus of the group is to encourage patients to go outside in the courtyard and get some fresh ar and some exercise.    Participation Level:  Did Not Attend   Henry Garcia 06/27/2023, 5:04 PM
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
## 2023-06-27 NOTE — Tx Team (Signed)
Initial Treatment Plan 06/27/2023 6:24 PM Henry Garcia [id number]    PATIENT STRESSORS: Financial difficulties   Marital or family conflict   Substance abuse     PATIENT STRENGTHS: Ability for insight  Arboriculturist fund of knowledge  Motivation for treatment/growth  Supportive family/friends  Work skills    PATIENT IDENTIFIED PROBLEMS: Alcohol abuse  SI, prior to admission, due to alcohol consumption  Financial issues                 DISCHARGE CRITERIA:  Ability to meet basic life and health needs Improved stabilization in mood, thinking, and/or behavior Motivation to continue treatment in a less acute level of care Need for constant or close observation no longer present Reduction of life-threatening or endangering symptoms to within safe limits Withdrawal symptoms are absent or subacute and managed without 24-hour nursing intervention  PRELIMINARY DISCHARGE PLAN: Outpatient therapy Return to previous living arrangement Return to previous work or school arrangements  PATIENT/FAMILY INVOLVEMENT: This treatment plan has been presented to and reviewed with the patient, Henry Garcia. The patient has been given the opportunity to ask questions and make suggestions.  Sheyanne Munley, RN 06/27/2023, 6:24 PM

## 2023-06-27 NOTE — Progress Notes (Signed)
Pt calm and pleasant during assessment denying SI/HI/AVH. Pt stated he just needs to slow down his drinking. Pt refused his night medication, check MAR. Pt given education, still refused. Pt given education, support, and encouragement to be active in his treatment plan. Pt being monitored Q 15 minutes for safety per unit protocol, remains safe on the unit

## 2023-06-27 NOTE — Progress Notes (Signed)
Patient also refused scheduled Librium and B1 injection. Earlene Plater, NP notified face-to-face.

## 2023-06-27 NOTE — Plan of Care (Signed)
New admission.  Problem: Education: Goal: Knowledge of [place] [facility] Education information/materials will improve Outcome: Not Progressing Goal: Emotional status will improve Outcome: Not Progressing Goal: Mental status will improve Outcome: Not Progressing Goal: Verbalization of understanding the information provided will improve Outcome: Not Progressing   Problem: Activity: Goal: Interest or engagement in activities will improve Outcome: Not Progressing Goal: Sleeping patterns will improve Outcome: Not Progressing   Problem: Coping: Goal: Ability to verbalize frustrations and anger appropriately will improve Outcome: Not Progressing Goal: Ability to demonstrate self-control will improve Outcome: Not Progressing   Problem: Health Behavior/Discharge Planning: Goal: Identification of resources available to assist in meeting health care needs will improve Outcome: Not Progressing Goal: Compliance with treatment plan for underlying cause of condition will improve Outcome: Not Progressing   Problem: Physical Regulation: Goal: Ability to maintain clinical measurements within normal limits will improve Outcome: Not Progressing   Problem: Safety: Goal: Periods of time without injury will increase Outcome: Not Progressing   Problem: Physical Regulation: Goal: Complications related to the disease process, condition or treatment will be avoided or minimized Outcome: Not Progressing   Problem: Safety: Goal: Ability to remain free from injury will improve Outcome: Not Progressing   Problem: Education: Goal: Knowledge of  General Education information/materials will improve Outcome: Not Progressing Goal: Emotional status will improve Outcome: Not Progressing Goal: Mental status will improve Outcome: Not Progressing Goal: Verbalization of understanding the information provided will improve Outcome: Not Progressing

## 2023-06-27 NOTE — ED Notes (Signed)
Meal given

## 2023-06-27 NOTE — ED Notes (Signed)
Patient to [facility] 1 from triage.  Patient oriented to unit regarding rounding and cameras for safety, patient verbalized understanding.  Patient denies SI at this time, reports that is was due to the alcohol.

## 2023-06-27 NOTE — Progress Notes (Signed)
Patient refused scheduled Metoprolol. NP will be notified.

## 2023-06-27 NOTE — Group Note (Signed)
Date:  06/27/2023 Time:  9:30 PM  Group Topic/Focus:  Orientation:   The focus of this group is to educate the patient on the purpose and policies of crisis stabilization and provide a format to answer questions about their admission.  The group details unit policies and expectations of patients while admitted.    Participation Level:  Active  Participation Quality:  Appropriate and Attentive  Affect:  Appropriate  Cognitive:  Alert and Appropriate  Insight: Appropriate  Engagement in Group:  Developing/Improving and Engaged  Modes of Intervention:  Clarification, Discussion, Education, Orientation, Rapport Building, and Support  Additional Comments:     Flavia Bruss 06/27/2023, 9:30 PM

## 2023-06-27 NOTE — BH Assessment (Signed)
Comprehensive Clinical Assessment (CCA) Note  06/27/2023 Henry Garcia [id number]  Chief Complaint: Patient is a 27 year old male presenting to [facility] ED under IVC. Per triage note Pt here being IVC'd for having thoughts of suicide off and on per pt , , NO HI , pt admits to drinking today but no drugs. During assessment patient appears alert and oriented x4, calm and cooperative. Patient reports "I was drinking too much, more than I should, I was cutting back." When asked the amounts that patient was drinking patient was unable to recall, he reports that he started drinking "around 4pm yesterday." Patient reports "I work multiple jobs and I get stressed." Patient reports that he lives alone and was alone when he was drinking but that he was on the phone with his wife when she called 911. Patient reports having a therapist in the past but that he has not seen his therapist in months, patient is also not on any current medications. Patient denies SI/HI/AH/VH.  Collateral information was provided by the patient's wife who reports "he was tearing up the house, I threatened to leave him and he threatened to kill himself." Wife reports that she believes that he would act on his threats due to past SI and an attempt with a gun.  Per Psyc NP Lerry Liner patient is recommended for Inpatient Chief Complaint  Patient presents with   IVC   Alcohol Problem   Visit Diagnosis: Alcohol use disorder, severe. Major Depressive Disorder, recurrent episode, severe    CCA Screening, Triage and Referral (STR)  Patient Reported Information How did you hear about [place]? Legal System  Referral name: No data recorded Referral phone number: No data recorded  Whom do you see for routine medical problems? No data recorded Practice/Facility Name: No data recorded Practice/Facility Phone Number: No data recorded Name of Contact: No data recorded Contact Number: No data recorded Contact Fax Number: No data  recorded Prescriber Name: No data recorded Prescriber Address (if known): No data recorded  What Is the Reason for Your Visit/Call Today? Pt here being IVC'd for having thoughts of suicide off and on per pt , , NO HI  , pt admits to drinking today but no drugs  How Long Has This Been Causing You Problems? > than 6 months  What Do You Feel Would Help You the Most Today? No data recorded  Have You Recently Been in Any Inpatient Treatment (Hospital/Detox/Crisis Center/28-Day Program)? No data recorded Name/Location of Program/Hospital:No data recorded How Long Were You There? No data recorded When Were You Discharged? No data recorded  Have You Ever Received Services From [facility] Before? No data recorded Who Do You See at [facility]? No data recorded  Have You Recently Had Any Thoughts About Hurting Yourself? Yes  Are You Planning to Commit Suicide/Harm Yourself At This time? No   Have you Recently Had Thoughts About Hurting Someone Karolee Ohs? No  Explanation: No data recorded  Have You Used Any Alcohol or Drugs in the Past 24 Hours? No data recorded How Long Ago Did You Use Drugs or Alcohol? No data recorded What Did You Use and How Much? No data recorded  Do You Currently Have a Therapist/Psychiatrist? No data recorded Name of Therapist/Psychiatrist: None   Have You Been Recently Discharged From Any Office Practice or Programs? No  Explanation of Discharge From Practice/Program: No data recorded    CCA Screening Triage Referral Assessment Type of Contact: Face-to-Face  Is this Initial or Reassessment? No  data recorded Date Telepsych consult ordered in [facility]:  No data recorded Time Telepsych consult ordered in [facility]:  No data recorded  Patient Reported Information Reviewed? No data recorded Patient Left Without Being Seen? No data recorded Reason for Not Completing Assessment: No data recorded  Collateral Involvement: No data recorded  Does Patient Have a Court  Appointed Legal Guardian? No data recorded Name and Contact of Legal Guardian: No data recorded If Minor and Not Living with Parent(s), Who has Custody? No data recorded Is CPS involved or ever been involved? Never  Is APS involved or ever been involved? Never   Patient Determined To Be At Risk for Harm To Self or Others Based on Review of Patient Reported Information or Presenting Complaint? No  Method: No data recorded Availability of Means: No data recorded Intent: No data recorded Notification Required: No data recorded Additional Information for Danger to Others Potential: No data recorded Additional Comments for Danger to Others Potential: No data recorded Are There Guns or Other Weapons in Your Home? Yes  Types of Guns/Weapons: Patient denies weapons but has a history of having guns in his home  Are These Weapons Safely Secured?                            -- (unknown)  Who Could Verify You Are Able To Have These Secured: No data recorded Do You Have any Outstanding Charges, Pending Court Dates, Parole/Probation? No data recorded Contacted To Inform of Risk of Harm To Self or Others: No data recorded  Location of Assessment: [facility] ED   Does Patient Present under Involuntary Commitment? Yes  IVC Papers Initial File Date: No data recorded  [place] of Residence: [place]   Patient Currently Receiving the Following Services: No data recorded  Determination of Need: Emergent (2 hours)   Options For Referral: No data recorded    CCA Biopsychosocial Intake/Chief Complaint:  No data recorded Current Symptoms/Problems: No data recorded  Patient Reported Schizophrenia/Schizoaffective Diagnosis in Past: No   Strengths: Patient is able to communicate his needs  Preferences: No data recorded Abilities: No data recorded  Type of Services Patient Feels are Needed: No data recorded  Initial Clinical Notes/Concerns: No data recorded  Mental Health Symptoms Depression:    Change in energy/activity; Fatigue; Irritability   Duration of Depressive symptoms:  Greater than two weeks   Mania:   None   Anxiety:    Fatigue; Irritability; Restlessness   Psychosis:   None   Duration of Psychotic symptoms: No data recorded  Trauma:   None   Obsessions:   None   Compulsions:   None   Inattention:   None   Hyperactivity/Impulsivity:   None   Oppositional/Defiant Behaviors:   Argumentative; Easily annoyed; Spiteful; Temper   Emotional Irregularity:   Recurrent suicidal behaviors/gestures/threats   Other Mood/Personality Symptoms:  No data recorded   Mental Status Exam Appearance and self-care  Stature:   Average   Weight:   Average weight   Clothing:   Casual   Grooming:   Normal   Cosmetic use:   None   Posture/gait:   Normal   Motor activity:   Not Remarkable   Sensorium  Attention:   Normal   Concentration:   Normal   Orientation:   X5   Recall/memory:   Defective in Short-term; Defective in Recent   Affect and Mood  Affect:   Appropriate   Mood:   Irritable  Relating  Eye contact:   Avoided   Facial expression:   Responsive   Attitude toward examiner:   Cooperative   Thought and Language  Speech flow:  Clear and Coherent   Thought content:   Appropriate to Mood and Circumstances   Preoccupation:   None   Hallucinations:   None   Organization:  No data recorded  [employer] of Knowledge:   Fair   Intelligence:   Average   Abstraction:   Normal   Judgement:   Impaired   Reality Testing:   Adequate   Insight:   Lacking; Denial   Decision Making:   Impulsive   Social Functioning  Social Maturity:   Impulsive   Social Judgement:   Heedless   Stress  Stressors:   Family conflict; Financial; Relationship   Coping Ability:   Exhausted   Skill Deficits:   None   Supports:   Family     Religion: Religion/Spirituality Are You A Religious  Person?: No  Leisure/Recreation: Leisure / Recreation Do You Have Hobbies?: No  Exercise/Diet: Exercise/Diet Do You Exercise?: No Have You Gained or Lost A Significant Amount of Weight in the Past Six Months?: No Do You Follow a Special Diet?: No Do You Have Any Trouble Sleeping?: No   CCA Employment/Education Employment/Work Situation: Employment / Work Situation Employment Situation: Employed Work Stressors: Patient reports having multiple jobs is stressful Has Patient ever Been in Equities trader?: No  Education: Education Is Patient Currently Attending School?: No Did You Have An Individualized Education Program (IIEP): No Did You Have Any Difficulty At [employer]?: No Patient's Education Has Been Impacted by Current Illness: No   CCA Family/Childhood History Family and Relationship History: Family history Marital status: Married Number of Years Married:  (unknown) What types of issues is patient dealing with in the relationship?: Patient's wife does not like that the patient drinks Additional relationship information: patient and wife are current separated Does patient have children?: Yes How many children?:  (unknown) How is patient's relationship with their children?: unknown  Childhood History:  Childhood History Did patient suffer any verbal/emotional/physical/sexual abuse as a child?: No Did patient suffer from severe childhood neglect?: No Has patient ever been sexually abused/assaulted/raped as an adolescent or adult?: No Was the patient ever a victim of a crime or a disaster?: No Witnessed domestic violence?: No Has patient been affected by domestic violence as an adult?: No  Child/Adolescent Assessment:     CCA Substance Use Alcohol/Drug Use: Alcohol / Drug Use Pain Medications: See PTA Prescriptions: See PTA Over the Counter: See PTA History of alcohol / drug use?: Yes Longest period of sobriety (when/how long): Unknown Negative Consequences of  Use: Personal relationships, Work / Programmer, multimedia, Surveyor, quantity Withdrawal Symptoms: Agitation, Aggressive/Assaultive, Patient aware of relationship between substance abuse and physical/medical complications Substance #1 Name of Substance 1: Alcohol 1 - Age of First Use: unknown 1 - Amount (size/oz): patient unable to report amounts 1 - Frequency: patient is a heavy and frequent drinker 1 - Last Use / Amount: 06/26/23                       ASAM's:  Six Dimensions of Multidimensional Assessment  Dimension 1:  Acute Intoxication and/or Withdrawal Potential:      Dimension 2:  Biomedical Conditions and Complications:      Dimension 3:  Emotional, Behavioral, or Cognitive Conditions and Complications:     Dimension 4:  Readiness to Change:  Dimension 5:  Relapse, Continued use, or Continued Problem Potential:     Dimension 6:  Recovery/Living Environment:     ASAM Severity Score:    ASAM Recommended Level of Treatment: ASAM Recommended Level of Treatment: Level III Residential Treatment   Substance use Disorder (SUD) Substance Use Disorder (SUD)  Checklist Symptoms of Substance Use: Continued use despite having a persistent/recurrent physical/psychological problem caused/exacerbated by use, Continued use despite persistent or recurrent social, interpersonal problems, caused or exacerbated by use, Recurrent use that results in a failure to fulfill major role obligations (work, school, home), Evidence of tolerance, Large amounts of time spent to obtain, use or recover from the substance(s), Persistent desire or unsuccessful efforts to cut down or control use, Presence of craving or strong urge to use, Social, occupational, recreational activities given up or reduced due to use, Substance(s) often taken in larger amounts or over longer times than was intended  Recommendations for Services/Supports/Treatments: Recommendations for Services/Supports/Treatments Recommendations For  Services/Supports/Treatments: Inpatient Hospitalization, CD-IOP Intensive Chemical Dependency Program, Detox, SAIOP (Substance Abuse Intensive Outpatient Program)  DSM5 Diagnoses: Patient Active Problem List   Diagnosis Date Noted   Paroxysmal tachycardia (HCC) 05/01/2023   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

## 2023-06-27 NOTE — Progress Notes (Signed)
Patient was found in bed, sleeping, not appearing in any acute distress. Patient remains safe on the unit and will continue to be monitored.   06/27/23 1800  CIWA-Ar  Nausea and Vomiting 0  Tactile Disturbances 0  Tremor 0  Auditory Disturbances 0  Paroxysmal Sweats 0  Visual Disturbances 0  Anxiety 0  Headache, Fullness in Head 0  Agitation 0  Orientation and Clouding of Sensorium 0  CIWA-Ar Total 0

## 2023-06-27 NOTE — Progress Notes (Signed)
Admission Note:   Report was received from [place], [place] on a 27 year old male who presents IVC in no acute distress for the treatment of SI and Alcohol intoxication. Patient was calm and cooperative with admission process. Patient states that the reason he is here is because he drank too much. Patient denies SI/HI/AVH and pain at this time. Patient also denies any signs/symptoms of depression/anxiety to this Clinical research associate. Patient's stressors are financial issues stating he is working two jobs trying to catch up on bills. Patient's goal for treatment is to "get out as soon as possible". Patient reports he doesn't have any issues with drinking, he has cut back, but the other night he drank a little too much. Patient has a past medical history of Alcohol dependence, Depression, and Heart Murmur. Skin was assessed with Talbert Forest, RN and found to be clear of any abnormal marks apart from some redness to his left upper arm, due to lab draw and tattoos to bilateral forearms. Patient searched and no contraband found and unit policies explained and understanding verbalized. Consents obtained. Food and fluids offered, and both accepted. Patient had no additional questions or concerns to voice to this Clinical research associate. Patient remains safe on the unit and will continue to be monitored.

## 2023-06-27 NOTE — ED Notes (Signed)
ivc prior to arrival/consult done/recommend psychiatric inpatient admission when medically cleared. 

## 2023-06-28 DIAGNOSIS — F1024 Alcohol dependence with alcohol-induced mood disorder: Secondary | ICD-10-CM

## 2023-06-28 MED ORDER — THIAMINE MONONITRATE 100 MG PO TABS
100.0000 mg | ORAL_TABLET | Freq: Every day | ORAL | Status: DC
  Administered 2023-06-28 – 2023-06-30 (×3): 100 mg via ORAL
  Filled 2023-06-28 (×4): qty 1

## 2023-06-28 MED ORDER — ESCITALOPRAM OXALATE 10 MG PO TABS
5.0000 mg | ORAL_TABLET | Freq: Every day | ORAL | Status: DC
  Administered 2023-06-28 – 2023-06-30 (×3): 5 mg via ORAL
  Filled 2023-06-28 (×3): qty 1

## 2023-06-28 NOTE — H&P (Signed)
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 The patient admits to drinking heavily, starting around 4 PM the previous evening, in an attempt to manage stress from working multiple jobs. He cannot recall how much he consumed, and his blood alcohol level (BAL) was 218 mg/dL at the time of evaluation, indicating significant intoxication.The patient states, "I was drinking too much, more than I should. I was trying to cut back." He denies remembering making suicidal statements or being hospitalized but acknowledges prior therapy at [facility], which he has not attended for over two months. He denies being on any current medications.Collateral information provided by the patient's wife indicates that during a family argument, he destroyed property in their home, and when she threatened to leave, he threatened to kill himself. She expressed concerns about his ongoing alcohol use, emotional volatility, and a history of a prior suicide attempt involving a firearm. She believes he would act on his threats if discharged.The patient denies current suicidal ideation (SI), homicidal ideation (HI), or hallucinations (auditory or visual). He denies current access to firearms and minimizes the events leading to his admission. His past medical history includes a hospitalization in March 2023 following a suicide  attempt involving a firearm. At present, he lives alone and reports feeling overwhelmed by his work and personal stressors.  The patient is being admitted for further stabilization, safety monitoring, and management of alcohol use and mood symptoms. Associated Signs/Symptoms: Depression Symptoms:  insomnia, feelings of worthlessness/guilt, difficulty concentrating, hopelessness, impaired memory, anxiety, disturbed sleep, (Hypo) Manic Symptoms:  Impulsivity, Irritable Mood, Anxiety Symptoms:   denies Psychotic Symptoms:   threatening wife with a weapon PTSD Symptoms: Negative Total Time spent with patient: 2 hours  Past Psychiatric History: none noted  Is the patient at risk to self? Yes.    Has the patient been a risk to self in the past 6 months? Yes.    Has the patient been a risk to self within the distant past? Yes.    Is the patient a risk to others? Yes.    Has the patient been a risk to others in the past 6 months? Yes.    Has the patient been a risk to others within the distant past? No.   [place] Scale:  Flowsheet Row Admission (Current) from 06/27/2023 in [facility] INPATIENT BEHAVIORAL MEDICINE Most recent reading at 06/27/2023  6:00 PM ED from 06/27/2023 in [facility] Emergency Department at [facility] Most recent reading at 06/26/2023 10:27 PM ED from 05/16/2023 in [facility] Emergency Department at [facility] Most recent reading at 05/16/2023  4:41 PM  C-SSRS RISK CATEGORY Low Risk High Risk No Risk        Prior Inpatient Therapy: No. If yes, describe no  Prior Outpatient Therapy: No. If yes, describe none   Alcohol Screening: 1. How often do you have a drink containing alcohol?: 2 to 3 times a week 2. How many drinks containing alcohol do you have on a typical day when you are drinking?: 5  or 6 3. How often do you have six or more drinks on one occasion?: Monthly AUDIT-C Score: 7 4. How often during the last year have you found that you were not able  to stop drinking once you had started?: Never 5. How often during the last year have you failed to do what was normally expected from you because of drinking?: Never 6. How often during the last year have you needed a first drink in the morning to get yourself going after a heavy drinking session?: Never 7. How often during the last year have you had a feeling of guilt of remorse after drinking?: Never 8. How often during the last year have you been unable to remember what happened the night before because you had been drinking?: Never 9. Have you or someone else been injured as a result of your drinking?: No 10. Has a relative or friend or a doctor or another health worker been concerned about your drinking or suggested you cut down?: No Alcohol Use Disorder Identification Test Final Score (AUDIT): 7 Alcohol Brief Interventions/Follow-up: Alcohol education/Brief advice Substance Abuse History in the last 12 months:  Yes.   Consequences of Substance Abuse: Family Consequences:  separation Withdrawal Symptoms:   Headaches Tremors Previous Psychotropic Medications: No  Psychological Evaluations: No  Past Medical History:  Past Medical History:  Diagnosis Date   Heart murmur    Heart murmur    Wolff-Parkinson-White syndrome     Past Surgical History:  Procedure Laterality Date   ELECTROPHYSIOLOGIC STUDY     08/2018   HAND SURGERY Right    5th finger, 2012 ad 2013   TONSILLECTOMY     Family History:  Family History  Problem Relation Age of Onset   Hyperlipidemia Father    Family Psychiatric  History: none reported Tobacco Screening:  Social History   Tobacco Use  Smoking Status Never  Smokeless Tobacco Never    BH Tobacco Counseling     Are you interested in Tobacco Cessation Medications?  N/A, patient does not use tobacco products Counseled patient on smoking cessation:  N/A, patient does not use tobacco products Reason Tobacco Screening Not Completed: No value filed.        Social History:  Social History   Substance and Sexual Activity  Alcohol Use Yes   Alcohol/week: 21.0 standard drinks of alcohol   Types: 21 Shots of liquor per week     Social History   Substance and Sexual Activity  Drug Use No    Additional Social History: Marital status: Married Number of Years Married: 10 What types of issues is patient dealing with in the relationship?: "both of [place] kind of have anger issues, sometime4s we don't understand each other" Does patient have children?: Yes How many children?: 2 How is patient's relationship with their children?: "outgoing, kind of fun"                         Allergies:   Allergies  Allergen Reactions   Cortisone Other (See Comments) and Shortness Of Breath    hiccups   Amoxicillin Itching    Took in combination with clarithromycin and omeprazole for h. Pylori. Developed urticaria in 4 days   Clarithromycin Itching    Took in combination with amoxicillin for h pylori. Developed rash   Lab Results:  Results for orders placed or performed during the hospital encounter of 06/27/23 (from the past 48 hour(s))  Comprehensive metabolic panel  Status: Abnormal   Collection Time: 06/26/23 10:30 PM  Result Value Ref Range   Sodium 134 (L) 135 - 145 mmol/L   Potassium 3.6 3.5 - 5.1 mmol/L   Chloride 100 98 - 111 mmol/L   CO2 22 22 - 32 mmol/L   Glucose, Bld 89 70 - 99 mg/dL    Comment: Glucose reference range applies only to samples taken after fasting for at least 8 hours.   BUN 11 6 - 20 mg/dL   Creatinine, Ser 2.53 0.61 - 1.24 mg/dL   Calcium 8.9 8.9 - 66.4 mg/dL   Total Protein 8.4 (H) 6.5 - 8.1 g/dL   Albumin 5.0 3.5 - 5.0 g/dL   AST 24 15 - 41 U/L   ALT 19 0 - 44 U/L   Alkaline Phosphatase 76 38 - 126 U/L   Total Bilirubin 0.7 <1.2 mg/dL   GFR, Estimated >40 >34 mL/min    Comment: (NOTE) Calculated using the CKD-EPI Creatinine Equation (2021)    Anion gap 12 5 - 15    Comment: Performed at  [facility], [address] [place], [place] [address]  Ethanol     Status: Abnormal   Collection Time: 06/26/23 10:30 PM  Result Value Ref Range   Alcohol, Ethyl (B) 218 (H) <10 mg/dL    Comment: (NOTE) Lowest detectable limit for serum alcohol is 10 mg/dL.  For medical purposes only. Performed at [facility], [address] [place], [place] [address]   Urine Drug Screen, Qualitative     Status: None   Collection Time: 06/26/23 10:30 PM  Result Value Ref Range   Tricyclic, Ur Screen NONE DETECTED NONE DETECTED   Amphetamines, Ur Screen NONE DETECTED NONE DETECTED   MDMA (Ecstasy)Ur Screen NONE DETECTED NONE DETECTED   Cocaine Metabolite,Ur [place] NONE DETECTED NONE DETECTED   Opiate, Ur Screen NONE DETECTED NONE DETECTED   Phencyclidine (PCP) Ur S NONE DETECTED NONE DETECTED   Cannabinoid 50 Ng, Ur [place] NONE DETECTED NONE DETECTED   Barbiturates, Ur Screen NONE DETECTED NONE DETECTED   Benzodiazepine, Ur Scrn NONE DETECTED NONE DETECTED   Methadone Scn, Ur NONE DETECTED NONE DETECTED    Comment: (NOTE) Tricyclics + metabolites, urine    Cutoff 1000 ng/mL Amphetamines + metabolites, urine  Cutoff 1000 ng/mL MDMA (Ecstasy), urine              Cutoff 500 ng/mL Cocaine Metabolite, urine          Cutoff 300 ng/mL Opiate + metabolites, urine        Cutoff 300 ng/mL Phencyclidine (PCP), urine         Cutoff 25 ng/mL Cannabinoid, urine                 Cutoff 50 ng/mL Barbiturates + metabolites, urine  Cutoff 200 ng/mL Benzodiazepine, urine              Cutoff 200 ng/mL Methadone, urine                   Cutoff 300 ng/mL  The urine drug screen provides only a preliminary, unconfirmed analytical test result and should not be used for non-medical purposes. Clinical consideration and professional judgment should be applied to any positive drug screen result due to possible interfering substances. A more specific alternate chemical method must be used in order to  obtain a confirmed analytical result. Gas chromatography / mass spectrometry (GC/MS) is the preferred confirm atory method. Performed at [facility], [address] [place], [place] [address]   CBC with Diff     Status: None   Collection Time: 06/26/23 10:30 PM  Result Value Ref Range   WBC 6.7 4.0 - 10.5 K/uL   RBC 4.59 4.22 - 5.81 MIL/uL   Hemoglobin 14.0 13.0 - 17.0 g/dL   HCT 60.4 54.0 - 98.1 %   MCV 87.8 80.0 - 100.0 fL   MCH 30.5 26.0 - 34.0 pg   MCHC 34.7 30.0 - 36.0 g/dL   RDW 19.1 47.8 - 29.5 %   Platelets 289 150 - 400 K/uL   nRBC 0.0 0.0 - 0.2 %   Neutrophils Relative % 65 %   Neutro Abs 4.4 1.7 - 7.7 K/uL   Lymphocytes Relative 24 %   Lymphs Abs 1.6 0.7 - 4.0 K/uL   Monocytes Relative 9 %   Monocytes Absolute 0.6 0.1 - 1.0 K/uL   Eosinophils Relative 1 %   Eosinophils Absolute 0.0 0.0 - 0.5 K/uL   Basophils Relative 1 %   Basophils Absolute 0.0 0.0 - 0.1 K/uL   Immature Granulocytes 0 %   Abs Immature Granulocytes 0.01 0.00 - 0.07 K/uL    Comment: Performed at [facility], [address] [place], [place] [address]  Acetaminophen level     Status: Abnormal   Collection Time: 06/26/23 10:30 PM  Result Value Ref Range   Acetaminophen (Tylenol), Serum <10 (L) 10 - 30 ug/mL    Comment: (NOTE) Therapeutic concentrations vary significantly. A range of 10-30 ug/mL  may be an effective concentration for many patients. However, some  are best treated at concentrations outside of this range. Acetaminophen concentrations >150 ug/mL at 4 hours after ingestion  and >50 ug/mL at 12 hours after ingestion are often associated with  toxic reactions.  Performed at [facility], [address] [place], [place] [address]   Salicylate level     Status: Abnormal   Collection Time: 06/26/23 10:30 PM  Result Value Ref Range   Salicylate Lvl <7.0 (L) 7.0 - 30.0 mg/dL    Comment: Performed at [facility], [address]  [place], [place] [address]    Blood Alcohol level:  Lab Results  Component Value Date   ETH 218 (H) 06/26/2023   ETH 223 (H) 12/02/2022    Metabolic Disorder Labs:  No results found for: "HGBA1C", "MPG" No results found for: "PROLACTIN" No results found for: "CHOL", "TRIG", "HDL", "CHOLHDL", "VLDL", "LDLCALC"  Current Medications: Current Facility-Administered Medications  Medication Dose Route Frequency Provider Last Rate Last Admin   acetaminophen (TYLENOL) tablet 650 mg  650 mg Oral Q6H PRN Dixon, Rashaun M, NP       alum & mag hydroxide-simeth (MAALOX/MYLANTA) 200-200-20 MG/5ML suspension 30 mL  30 mL Oral Q4H PRN Dixon, Rashaun M, NP       chlordiazePOXIDE (LIBRIUM) capsule 25 mg  25 mg Oral Q6H PRN Myriam Forehand, NP       chlordiazePOXIDE (LIBRIUM) capsule 25 mg  25 mg Oral QID Myriam Forehand, NP       Followed by   Melene Muller ON 06/29/2023] chlordiazePOXIDE (LIBRIUM) capsule 25 mg  25 mg Oral TID Myriam Forehand, NP       Followed by   Melene Muller ON 06/30/2023] chlordiazePOXIDE (LIBRIUM) capsule 25 mg  25 mg Oral Lincoln Maxin, NP       Followed by   Melene Muller ON 07/01/2023] chlordiazePOXIDE (LIBRIUM) capsule 25 mg  25 mg Oral Daily Myriam Forehand, NP       chlordiazePOXIDE (LIBRIUM) capsule 50 mg  50 mg Oral Once Myriam Forehand, NP       escitalopram (LEXAPRO) tablet 5 mg  5 mg Oral Daily Myriam Forehand, NP       haloperidol (HALDOL) tablet 5 mg  5 mg Oral TID PRN Jearld Lesch, NP       haloperidol lactate (HALDOL) injection 10 mg  10 mg Intramuscular TID PRN Jearld Lesch, NP       haloperidol lactate (HALDOL) injection 5 mg  5 mg Intramuscular TID PRN Jearld Lesch, NP       hydrOXYzine (ATARAX) tablet 25 mg  25 mg Oral Q6H PRN Myriam Forehand, NP       loperamide (IMODIUM) capsule 2-4 mg  2-4 mg Oral PRN Myriam Forehand, NP   2 mg at 06/27/23 1450   magnesium hydroxide (MILK OF MAGNESIA) suspension 30 mL  30 mL Oral Daily PRN Jearld Lesch, NP       metoprolol succinate  (TOPROL-XL) 24 hr tablet 25 mg  25 mg Oral Daily Dixon, Rashaun M, NP       multivitamin with minerals tablet 1 tablet  1 tablet Oral Daily Myriam Forehand, NP   1 tablet at 06/28/23 0902   ondansetron (ZOFRAN-ODT) disintegrating tablet 4 mg  4 mg Oral Q6H PRN Myriam Forehand, NP       thiamine (VITAMIN B1) tablet 100 mg  100 mg Oral Daily Myriam Forehand, NP   100 mg at 06/28/23 1410   traZODone (DESYREL) tablet 50 mg  50 mg Oral QHS PRN Jearld Lesch, NP       PTA Medications: Medications Prior to Admission  Medication Sig Dispense Refill Last Dose   metoprolol succinate (TOPROL-XL) 25 MG 24 hr tablet Take 1 tablet (25 mg total) by mouth daily. Take with or immediately following a meal. 90 tablet 3    Multiple Vitamin (MULTI VITAMIN MENS PO) Take by mouth daily.       Musculoskeletal: Strength & Muscle Tone: within normal limits Gait & Station: normal Patient leans: N/A            Psychiatric Specialty Exam:  Presentation  General Appearance:  Appropriate for Environment; Neat  Eye Contact: Good  Speech: Clear and Coherent; Normal Rate  Speech Volume: Normal  Handedness: Right   Mood and Affect  Mood: Anxious  Affect: Full Range   Thought Process  Thought Processes: Coherent  Duration of Psychotic Symptoms:N/A Past Diagnosis of Schizophrenia or Psychoactive disorder: No  Descriptions of Associations:Intact  Orientation:Full (Time, Place and Person)  Thought Content:WDL  Hallucinations:Hallucinations: None  Ideas of Reference:None  Suicidal Thoughts:Suicidal Thoughts: No  Homicidal Thoughts:Homicidal Thoughts: No   Sensorium  Memory: Immediate Good; Remote Good  Judgment: Fair  Insight: Poor   Executive Functions  Concentration: Fair  Attention Span: Fair  Recall: Good  Fund of Knowledge: Good  Language: Good   Psychomotor Activity  Psychomotor Activity: Psychomotor Activity: Normal   Assets   Assets: Communication Skills; Housing; Social Support; Physical Health   Sleep  Sleep: Sleep: Good Number of Hours of Sleep: 7    Physical Exam: Physical Exam Vitals and nursing note reviewed.  Constitutional:      Appearance: Normal appearance.  HENT:     Head: Normocephalic and atraumatic.     Nose: Nose normal.  Pulmonary:  Effort: Pulmonary effort is normal.  Musculoskeletal:        General: Normal range of motion.     Cervical back: Normal range of motion.  Neurological:     Mental Status: He is alert.  Psychiatric:        Attention and Perception: Attention and perception normal.        Mood and Affect: Affect normal. Mood is anxious.        Speech: Speech normal.        Behavior: Behavior normal. Behavior is cooperative.        Thought Content: Thought content normal.        Cognition and Memory: Cognition and memory normal.        Judgment: Judgment normal.    Review of Systems  Psychiatric/Behavioral:  Positive for substance abuse. The patient is nervous/anxious.   All other systems reviewed and are negative.  Blood pressure 114/66, pulse 62, temperature 97.7 F (36.5 C), resp. rate 18, height 5\' 5"  (1.651 m), weight 59.9 kg, SpO2 97%. Body mass index is 21.97 kg/m.  Treatment Plan Summary: Daily contact with patient to assess and evaluate symptoms and progress in treatment and Medication management Initiate CIWA ([facility] Withdrawal Assessment) to monitor and manage potential alcohol withdrawal symptoms.Administer Librium as needed per protocol. Gabapentin 100 mg TID: To address anxiety and potential alcohol withdrawal symptoms. Lexapro (Escitalopram) 5 mg daily: For mood stabilization and management of depressive symptoms. Encourage participation in group therapy sessions, particularly related to anger management and substance use Schedule a multidisciplinary treatment team meeting to review progress and adjust the care plan as needed.     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) Long Term Goal(s): Improvement in symptoms so as ready for discharge  Short Term Goals: Ability to identify changes in lifestyle to reduce recurrence of condition will improve, Ability to verbalize feelings will improve, Ability to disclose and discuss suicidal ideas, Ability to demonstrate self-control will improve, Ability to identify and develop effective coping behaviors will improve, Ability to maintain clinical measurements within normal limits will improve, Compliance with prescribed medications will improve, and Ability to identify triggers associated with substance abuse/mental health issues will improve  Physician Treatment Plan for Secondary Diagnosis: Principal Problem:   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)  Long Term Goal(s): Improvement in symptoms so as ready for discharge  Short Term Goals: Ability to identify changes in lifestyle to reduce recurrence of condition will improve, Ability to verbalize feelings will improve, Ability to disclose and discuss suicidal ideas, Ability to demonstrate self-control will improve, Ability to identify and develop effective coping behaviors will improve, Ability to maintain clinical measurements within normal limits will improve, Compliance with prescribed medications will improve, and Ability to identify triggers associated with substance abuse/mental health issues will improve  I certify that inpatient services furnished can reasonably be expected to improve the patient's condition.    Myriam Forehand, NP 12/10/20244:28 PM

## 2023-06-28 NOTE — Group Note (Signed)
Recreation Therapy Group Note   Group Topic:Healthy Support Systems  Group Date: 06/28/2023 Start Time: 1000 End Time: 1100 Facilitators: Rosina Lowenstein, LRT, CTRS Location:  Craft Room  Group Description: Straw Bridge. In groups or individually, patients were given 10 plastic drinking straws and an equal length of masking tape. Using the materials provided, patients were instructed to build a free-standing bridge-like structure to suspend an everyday item (ex: deck of cards) off the floor or table surface. All materials were required to be used in Secondary school teacher. LRT facilitated post-activity discussion reviewing the importance of having strong and healthy support systems in our lives. LRT discussed how the people in our lives serve as the tape and the deck of cards we placed on top of our straw structure are the stressors we face in daily life. LRT and pts discussed what happens in our life when things get too heavy for [place], and we don't have strong supports outside of the hospital. Pt shared 2 of their healthy supports in their life aloud in the group.   Goal Area(s) Addressed:  Patient will identify 2 healthy supports in their life. Patient will identify skills to successfully complete activity. Patient will identify correlation of this activity to life post-discharge.  Patient will build on frustration tolerance skills. Patient will increase team building and communication skills.    Affect/Mood: Appropriate   Participation Level: Active and Engaged   Participation Quality: Independent   Behavior: Calm   Speech/Thought Process: Coherent   Insight: Fair   Judgement: Fair    Modes of Intervention: Education, Group work, Guided Discussion, Dentist, and Team-building   Patient Response to Interventions:  Receptive   Education Outcome:  Acknowledges education   Clinical Observations/Individualized Feedback: [place] was mostly active in their participation of session  activities and group discussion. Pt came late to group, however, joined with no issue. Pt identified: "my wife and kids" as his healthy supports. Pt interacted well with LRT and peers duration of session.    Plan: Continue to engage patient in RT group sessions 2-3x/week.   Rosina Lowenstein, LRT, CTRS 06/28/2023 1:06 PM

## 2023-06-28 NOTE — Plan of Care (Signed)
Pt refused his night medication, check MAR, but denies SI/HI/AVH  Problem: Education: Goal: Knowledge of [facility] Education information/materials will improve Outcome: Progressing Goal: Emotional status will improve Outcome: Progressing Goal: Mental status will improve Outcome: Progressing Goal: Verbalization of understanding the information provided will improve Outcome: Progressing   Problem: Activity: Goal: Interest or engagement in activities will improve Outcome: Progressing Goal: Sleeping patterns will improve Outcome: Progressing   Problem: Coping: Goal: Ability to verbalize frustrations and anger appropriately will improve Outcome: Progressing Goal: Ability to demonstrate self-control will improve Outcome: Progressing   Problem: Health Behavior/Discharge Planning: Goal: Identification of resources available to assist in meeting health care needs will improve Outcome: Progressing Goal: Compliance with treatment plan for underlying cause of condition will improve Outcome: Progressing   Problem: Physical Regulation: Goal: Ability to maintain clinical measurements within normal limits will improve Outcome: Progressing   Problem: Safety: Goal: Periods of time without injury will increase Outcome: Progressing   Problem: Physical Regulation: Goal: Complications related to the disease process, condition or treatment will be avoided or minimized Outcome: Progressing   Problem: Safety: Goal: Ability to remain free from injury will improve Outcome: Progressing

## 2023-06-28 NOTE — Group Note (Signed)
Date:  06/28/2023 Time:  9:05 PM  Group Topic/Focus:  Recovery Goals:   The focus of this group is to identify appropriate goals for recovery and establish a plan to achieve them. Wrap-Up Group:   The focus of this group is to help patients review their daily goal of treatment and discuss progress on daily workbooks.    Participation Level:  Minimal  Participation Quality:  Appropriate and Attentive  Affect:  Appropriate  Cognitive:  Alert and Appropriate  Insight: Appropriate  Engagement in Group:  Improving  Modes of Intervention:  Discussion  Additional Comments:     Maglione,Kazimierz Springborn E 06/28/2023, 9:05 PM
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
## 2023-06-28 NOTE — Progress Notes (Signed)
Assumed care of patient this am, he present A&O x 4, affect & mood were animated, thoughts clear and appropriate to situation..  Pt out in the milieu, sociable, attended groups and expressed a desire to be discharge throughout the day. Pt was without complaints. Pt denied thoughts, plan or intent to harm self or others and made a safety commitment, Pt also denied A/V/H, Pt educated on plan of care, medication regimen and he acknowledged an understanding and was without further complaints or concerns. Pt maintained on q 15 min rounds for safety and support.

## 2023-06-28 NOTE — Group Note (Signed)
[facility] LCSW Group Therapy Note   Group Date: 06/28/2023 Start Time: 1300 End Time: 1400  Type of Therapy/Topic:  Group Therapy:  Feelings about Diagnosis  Participation Level:  Active   Description of Group:    This group will allow patients to explore their thoughts and feelings about diagnoses they have received. Patients will be guided to explore their level of understanding and acceptance of these diagnoses. Facilitator will encourage patients to process their thoughts and feelings about the reactions of others to their diagnosis, and will guide patients in identifying ways to discuss their diagnosis with significant others in their lives. This group will be process-oriented, with patients participating in exploration of their own experiences as well as giving and receiving support and challenge from other group members.   Therapeutic Goals: 1. Patient will demonstrate understanding of diagnosis as evidence by identifying two or more symptoms of the disorder:  2. Patient will be able to express two feelings regarding the diagnosis 3. Patient will demonstrate ability to communicate their needs through discussion and/or role plays  Summary of Patient Progress: Patient was present in group.  Patient was an active participant and supportive of other group members.  Patient was able to engage in discussion on the stigma associated with the stigma attached to mental illness.  He was engaged in discussion on the differences between mental health and physical health diagnoses.Insight was fair.  Therapeutic Modalities:   Cognitive Behavioral Therapy Brief Therapy Feelings Identification    Harden Mo, LCSW
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
## 2023-06-28 NOTE — Progress Notes (Signed)
Pt calm and pleasant during assessment denying SI/HI/AVH. Pt observed interacting appropriately with staff and peers on the unit. Pt compliant with medication administration per MD orders. Pt given education, support, and encouragement to be active in his treatment plan. Pt being monitored Q 15 minutes for safety per unit protocol, remains safe on the unit. 

## 2023-06-28 NOTE — Plan of Care (Signed)

## 2023-06-28 NOTE — Group Note (Signed)
Date:  06/28/2023 Time:  3:41 PM  Group Topic/Focus:  Overcoming Stress:   The focus of this group is to define stress and help patients assess their triggers.    Participation Level:  Active  Participation Quality:  Appropriate  Affect:  Appropriate  Cognitive:  Appropriate  Insight: Appropriate  Engagement in Group:  Engaged  Modes of Intervention:  Activity  Additional Comments:    Mary Sella Rucha Wissinger 06/28/2023, 3:41 PM

## 2023-06-28 NOTE — Group Note (Signed)
Date:  06/28/2023 Time:  10:08 AM  Group Topic/Focus:  Goals Group:   The focus of this group is to help patients establish daily goals to achieve during treatment and discuss how the patient can incorporate goal setting into their daily lives to aide in recovery.    Participation Level:  Active  Participation Quality:  Appropriate  Affect:  Appropriate  Cognitive:  Appropriate  Insight: Appropriate  Engagement in Group:  Engaged  Modes of Intervention:  Discussion, Education, and Support  Additional Comments:    Wilford Corner 06/28/2023, 10:08 AM

## 2023-06-29 NOTE — Plan of Care (Signed)
  Problem: Education: Goal: Knowledge of [facility] Education information/materials will improve Outcome: Progressing Goal: Emotional status will improve Outcome: Progressing Goal: Mental status will improve Outcome: Progressing Goal: Verbalization of understanding the information provided will improve Outcome: Progressing   Problem: Activity: Goal: Interest or engagement in activities will improve Outcome: Progressing Goal: Sleeping patterns will improve Outcome: Progressing   Problem: Coping: Goal: Ability to verbalize frustrations and anger appropriately will improve Outcome: Progressing Goal: Ability to demonstrate self-control will improve Outcome: Progressing   Problem: Health Behavior/Discharge Planning: Goal: Identification of resources available to assist in meeting health care needs will improve Outcome: Progressing Goal: Compliance with treatment plan for underlying cause of condition will improve Outcome: Progressing   Problem: Physical Regulation: Goal: Ability to maintain clinical measurements within normal limits will improve Outcome: Progressing   Problem: Safety: Goal: Periods of time without injury will increase Outcome: Progressing   Problem: Physical Regulation: Goal: Complications related to the disease process, condition or treatment will be avoided or minimized Outcome: Progressing   Problem: Safety: Goal: Ability to remain free from injury will improve Outcome: Progressing

## 2023-06-29 NOTE — Group Note (Signed)
LCSW Group Therapy Note   Group Date: 06/29/2023 Start Time: 1300 End Time: 1400   Type of Therapy and Topic:  Group Therapy: Challenging Core Beliefs  Participation Level:  Active  Description of Group:  Patients were educated about core beliefs and asked to identify one harmful core belief that they have. Patients were asked to explore from where those beliefs originate. Patients were asked to discuss how those beliefs make them feel and the resulting behaviors of those beliefs. They were then be asked if those beliefs are true and, if so, what evidence they have to support them. Lastly, group members were challenged to replace those negative core beliefs with helpful beliefs.   Therapeutic Goals:   1. Patient will identify harmful core beliefs and explore the origins of such beliefs. 2. Patient will identify feelings and behaviors that result from those core beliefs. 3. Patient will discuss whether such beliefs are true. 4.  Patient will replace harmful core beliefs with helpful ones.  Summary of Patient Progress:  Patient actively engaged in processing and exploring how core beliefs are formed and how they impact thoughts, feelings, and behaviors. Patient proved open to input from peers and feedback from CSW. Patient demonstrated proficient  insight into the subject matter, was respectful and supportive of peers, and participated throughout the entire session.  Therapeutic Modalities: Cognitive Behavioral Therapy; Solution-Focused Therapy   Lowry Ram, LCSWA 06/29/2023  3:06 PM

## 2023-06-29 NOTE — Progress Notes (Signed)
Pt calm and pleasant during assessment denying SI/HI/AVH. Pt observed interacting appropriately with staff and peers on the unit. Pt compliant with medication administration per MD orders. Pt given education, support, and encouragement to be active in his treatment plan. Pt being monitored Q 15 minutes for safety per unit protocol, remains safe on the unit. 

## 2023-06-29 NOTE — Group Note (Signed)
Recreation Therapy Group Note   Group Topic:Relaxation  Group Date: 06/29/2023 Start Time: 1000 End Time: 1050 Facilitators: Rosina Lowenstein, LRT, CTRS Location:  C[facility]  Group Description: PMR (Progressive Muscle Relaxation). LRT asks patients their current level of stress/anxiety from 1-10, with 10 being the highest. LRT educates patients on what PMR is and the benefits that come from it. Patients are asked to sit with their feet flat on the floor while sitting up and all the way back in their chair, if possible. LRT and pts follow a prompt through a speaker that requires you to tense and release different muscles in their body and focus on their breathing. During session, lights are off and soft music is being played. Pts are given a stress ball to use if needed. At the end of the prompt, LRT asks patients to rank their current levels of stress/anxiety from 1-10, 10 being the highest. LRT provides patients with an education handout on PMR.   Goal Area(s) Addressed:  Patients will be able to describe progressive muscle relaxation.  Patient will practice using relaxation technique. Patient will identify a new coping skill.  Patient will follow multistep directions to reduce anxiety and stress.   Affect/Mood: Appropriate   Participation Level: Active and Engaged   Participation Quality: Independent   Behavior: Appropriate, Calm, and Cooperative   Speech/Thought Process: Coherent   Insight: Good   Judgement: Good   Modes of Intervention: Activity, Education, and Exploration   Patient Response to Interventions:  Attentive, Engaged, Receptive, and Requested additional information/resources    Education Outcome:  Acknowledges education   Clinical Observations/Individualized Feedback: Henry Garcia was active in their participation of session activities and group discussion. Pt identified that his anxiety and stress were a 0 before and after the session. Pt received a stress ball  after group. Pt interacted well with LRT and peers duration of session.    Plan: Continue to engage patient in RT group sessions 2-3x/week.   Rosina Lowenstein, LRT, CTRS 06/29/2023 12:56 PM

## 2023-06-29 NOTE — Group Note (Signed)
Date:  06/29/2023 Time:  10:34 AM  Group Topic/Focus:  Building Self Esteem:   The Focus of this group is helping patients become aware of the effects of self-esteem on their lives, the things they and others do that enhance or undermine their self-esteem, seeing the relationship between their level of self-esteem and the choices they make and learning ways to enhance self-esteem.    Participation Level:  Active  Participation Quality:  Appropriate  Affect:  Appropriate  Cognitive:  Appropriate  Insight: Appropriate  Engagement in Group:  Engaged  Modes of Intervention:  Activity  Additional Comments:    Mary Sella Zarion Oliff 06/29/2023, 10:34 AM

## 2023-06-29 NOTE — Group Note (Signed)
Date:  06/29/2023 Time:  9:40 PM  Group Topic/Focus:  Stages of Change:   The focus of this group is to explain the stages of change and help patients identify changes they want to make upon discharge.    Participation Level:  Active  Participation Quality:  Appropriate and Attentive  Affect:  Appropriate  Cognitive:  Alert and Appropriate  Insight: Appropriate, Good, and Improving  Engagement in Group:  Developing/Improving and Engaged  Modes of Intervention:  Activity, Clarification, Discussion, Education, Rapport Building, Dance movement psychotherapist, Role-play, and Support  Additional Comments:     Edwardine Deschepper 06/29/2023, 9:40 PM

## 2023-06-29 NOTE — Progress Notes (Signed)
   06/29/23 0900  Psych Admission Type (Psych Patients Only)  Admission Status Involuntary  Psychosocial Assessment  Patient Complaints None  Eye Contact Fair  Facial Expression Animated  Affect Preoccupied  Speech Logical/coherent  Interaction Assertive  Motor Activity Restless  Appearance/Hygiene Unremarkable  Behavior Characteristics Cooperative;Appropriate to situation  Mood Preoccupied;Pleasant  Thought Process  Coherency WDL  Content Blaming self  Delusions None reported or observed  Perception WDL  Hallucination None reported or observed  Judgment Impaired  Confusion None  Danger to Self  Current suicidal ideation? Denies  Agreement Not to Harm Self Yes  Description of Agreement yes  Danger to Others  Danger to Others None reported or observed

## 2023-06-29 NOTE — Progress Notes (Signed)
[facility] MD Progress Note  06/30/2023 3:13 PM Chief Perreault  MRN:  [id number] Subjective:   27 year old Hispanic male presenting for daily evaluation. He inquires, "Can I go home tomorrow?" and requests a medical excuse for work. The patient denies suicidal ideation (SI), homicidal ideation (HI), auditory hallucinations (AH), visual hallucinations (VH), or delusions. No new concerns or complaints were reported during this session Principal Problem: Alcohol dependence (HCC) Diagnosis: Principal Problem:   Alcohol dependence (HCC) Active Problems:   Adjustment disorder with mixed disturbance of emotions and conduct  Total Time spent with patient: 1.5 hours  Past Psychiatric History: none prior to this admission  Past Medical History:  Past Medical History:  Diagnosis Date   Heart murmur    Heart murmur    Wolff-Parkinson-White syndrome     Past Surgical History:  Procedure Laterality Date   ELECTROPHYSIOLOGIC STUDY     08/2018   HAND SURGERY Right    5th finger, 2012 ad 2013   TONSILLECTOMY     Family History:  Family History  Problem Relation Age of Onset   Hyperlipidemia Father    Family Psychiatric  History: none reported Social History:  Social History   Substance and Sexual Activity  Alcohol Use Yes   Alcohol/week: 21.0 standard drinks of alcohol   Types: 21 Shots of liquor per week     Social History   Substance and Sexual Activity  Drug Use No    Social History   Socioeconomic History   Marital status: Married    Spouse name: Not on file   Number of children: Not on file   Years of education: Not on file   Highest education level: Not on file  Occupational History   Not on file  Tobacco Use   Smoking status: Never   Smokeless tobacco: Never  Vaping Use   Vaping status: Never Used  Substance and Sexual Activity   Alcohol use: Yes    Alcohol/week: 21.0 standard drinks of alcohol    Types: 21 Shots of liquor per week   Drug use: No   Sexual activity:  Not on file  Other Topics Concern   Not on file  Social History Narrative   Not on file   Social Drivers of Health   Financial Resource Strain: Not on file  Food Insecurity: No Food Insecurity (06/27/2023)   Hunger Vital Sign    Worried About Running Out of Food in the Last Year: Never true    Ran Out of Food in the Last Year: Never true  Transportation Needs: No Transportation Needs (06/27/2023)   PRAPARE - Administrator, Civil Service (Medical): No    Lack of Transportation (Non-Medical): No  Physical Activity: Not on file  Stress: Not on file  Social Connections: Not on file   Additional Social History:                         Sleep: Good  Appetite:  Good  Current Medications: Current Facility-Administered Medications  Medication Dose Route Frequency Provider Last Rate Last Admin   acetaminophen (TYLENOL) tablet 650 mg  650 mg Oral Q6H PRN Dixon, Rashaun M, NP       alum & mag hydroxide-simeth (MAALOX/MYLANTA) 200-200-20 MG/5ML suspension 30 mL  30 mL Oral Q4H PRN Dixon, Rashaun M, NP       chlordiazePOXIDE (LIBRIUM) capsule 25 mg  25 mg Oral Lincoln Maxin, NP   25 mg at 06/30/23  2956   Followed by   Melene Muller ON 07/01/2023] chlordiazePOXIDE (LIBRIUM) capsule 25 mg  25 mg Oral Daily Myriam Forehand, NP       chlordiazePOXIDE (LIBRIUM) capsule 50 mg  50 mg Oral Once Myriam Forehand, NP       escitalopram (LEXAPRO) tablet 5 mg  5 mg Oral Daily Myriam Forehand, NP   5 mg at 06/30/23 2130   haloperidol (HALDOL) tablet 5 mg  5 mg Oral TID PRN Jearld Lesch, NP       haloperidol lactate (HALDOL) injection 10 mg  10 mg Intramuscular TID PRN Jearld Lesch, NP       haloperidol lactate (HALDOL) injection 5 mg  5 mg Intramuscular TID PRN Jearld Lesch, NP       magnesium hydroxide (MILK OF MAGNESIA) suspension 30 mL  30 mL Oral Daily PRN Jearld Lesch, NP       metoprolol succinate (TOPROL-XL) 24 hr tablet 25 mg  25 mg Oral Daily Dixon, Rashaun M, NP    25 mg at 06/30/23 8657   multivitamin with minerals tablet 1 tablet  1 tablet Oral Daily Myriam Forehand, NP   1 tablet at 06/30/23 8469   thiamine (VITAMIN B1) tablet 100 mg  100 mg Oral Daily Myriam Forehand, NP   100 mg at 06/30/23 6295   traZODone (DESYREL) tablet 50 mg  50 mg Oral QHS PRN Jearld Lesch, NP        Lab Results: No results found for this or any previous visit (from the past 48 hours).  Blood Alcohol level:  Lab Results  Component Value Date   ETH 218 (H) 06/26/2023   ETH 223 (H) 12/02/2022    Metabolic Disorder Labs: No results found for: "HGBA1C", "MPG" No results found for: "PROLACTIN" No results found for: "CHOL", "TRIG", "HDL", "CHOLHDL", "VLDL", "LDLCALC"  Physical Findings: AIMS:  , ,  ,  ,    CIWA:  CIWA-Ar Total: 0 COWS:     Musculoskeletal: Strength & Muscle Tone: within normal limits Gait & Station: normal Patient leans: N/A  Psychiatric Specialty Exam:  Presentation  General Appearance:  Appropriate for Environment; Neat  Eye Contact: Good  Speech: Normal Rate; Clear and Coherent  Speech Volume: Normal  Handedness: Right   Mood and Affect  Mood: Anxious  Affect: Appropriate; Congruent   Thought Process  Thought Processes: Coherent  Descriptions of Associations:Intact  Orientation:Full (Time, Place and Person) (and situation)  Thought Content:WDL  History of Schizophrenia/Schizoaffective disorder:No  Duration of Psychotic Symptoms:none recorded Hallucinations:Hallucinations: None  Ideas of Reference:None  Suicidal Thoughts:Suicidal Thoughts: No  Homicidal Thoughts:Homicidal Thoughts: No   Sensorium  Memory: Immediate Good; Remote Good  Judgment: Good  Insight: Fair   Executive Functions  Concentration: Good  Attention Span: Good  Recall: Good  Fund of Knowledge: Good  Language: Good   Psychomotor Activity  Psychomotor Activity: Psychomotor Activity: Normal   Assets   Assets: Communication Skills; Social Support; Housing; Health and safety inspector   Sleep  Sleep: Sleep: Good Number of Hours of Sleep: 7    Physical Exam: Physical Exam Vitals and nursing note reviewed.  Constitutional:      Appearance: Normal appearance.  HENT:     Head: Normocephalic and atraumatic.     Nose: Nose normal.  Pulmonary:     Effort: Pulmonary effort is normal.  Musculoskeletal:        General: Normal range of motion.     Cervical  back: Normal range of motion.  Neurological:     General: No focal deficit present.     Mental Status: He is alert and oriented to person, place, and time. Mental status is at baseline.  Psychiatric:        Attention and Perception: Attention and perception normal.        Mood and Affect: Mood and affect normal.        Speech: Speech normal.        Behavior: Behavior normal. Behavior is cooperative.        Thought Content: Thought content normal.        Cognition and Memory: Cognition and memory normal.        Judgment: Judgment normal.    Review of Systems  All other systems reviewed and are negative.  Blood pressure 102/62, pulse 71, temperature 98 F (36.7 C), temperature source Oral, resp. rate 20, height 5\' 5"  (1.651 m), weight 59.9 kg, SpO2 98%. Body mass index is 21.97 kg/m.   Treatment Plan Summary: CIWA ([facility] Withdrawal Assessment) to monitor and manage potential alcohol withdrawal symptoms.Administer Librium as needed per protocol. Gabapentin 100 mg TID: To address anxiety and potential alcohol withdrawal symptoms. Lexapro (Escitalopram) 5 mg daily: For mood stabilization and management of depressive symptoms. Encourage participation in group therapy sessions, particularly related to anger management and substance use Schedule a multidisciplinary treatment team meeting to review progress and adjust the care plan as needed.  Myriam Forehand, NP 06/30/2023, 3:13 PM

## 2023-06-29 NOTE — BH IP Treatment Plan (Signed)
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)   Current Medications:  Current Facility-Administered Medications  Medication Dose Route Frequency Provider Last Rate Last Admin   acetaminophen (TYLENOL) tablet 650 mg  650 mg Oral Q6H PRN Jearld Lesch, NP       alum & mag hydroxide-simeth (MAALOX/MYLANTA) 200-200-20 MG/5ML suspension 30 mL  30 mL Oral Q4H PRN Dixon, Rashaun M, NP       chlordiazePOXIDE (LIBRIUM) capsule 25 mg  25 mg Oral Q6H PRN Myriam Forehand, NP       chlordiazePOXIDE (LIBRIUM) capsule 25 mg  25 mg Oral TID Myriam Forehand, NP   25 mg at 06/29/23 5643   Followed by   Melene Muller ON 06/30/2023] chlordiazePOXIDE (LIBRIUM) capsule 25 mg  25 mg Oral Lincoln Maxin, NP       Followed by   Melene Muller ON 07/01/2023] chlordiazePOXIDE (LIBRIUM) capsule 25 mg  25 mg Oral Daily Myriam Forehand, NP       chlordiazePOXIDE (LIBRIUM) capsule 50 mg  50 mg Oral Once Myriam Forehand, NP       escitalopram (LEXAPRO) tablet 5 mg  5 mg Oral Daily Myriam Forehand, NP   5 mg at 06/29/23 0919   haloperidol (HALDOL) tablet 5 mg  5 mg Oral TID PRN Jearld Lesch, NP       haloperidol lactate (HALDOL) injection 10 mg  10 mg Intramuscular TID PRN Jearld Lesch, NP       haloperidol lactate (HALDOL) injection 5 mg  5 mg Intramuscular TID PRN Jearld Lesch, NP       hydrOXYzine (ATARAX) tablet 25 mg  25 mg Oral Q6H PRN Myriam Forehand, NP       loperamide (IMODIUM) capsule 2-4 mg  2-4 mg Oral PRN Myriam Forehand, NP   2 mg at 06/28/23 2116   magnesium hydroxide (MILK OF MAGNESIA) suspension 30 mL  30 mL Oral Daily PRN Jearld Lesch, NP       metoprolol succinate (TOPROL-XL) 24 hr tablet 25 mg  25 mg Oral  Daily Dixon, Rashaun M, NP   25 mg at 06/29/23 0920   multivitamin with minerals tablet 1 tablet  1 tablet Oral Daily Myriam Forehand, NP   1 tablet at 06/29/23 0920   ondansetron (ZOFRAN-ODT) disintegrating tablet 4 mg  4 mg Oral Q6H PRN Myriam Forehand, NP       thiamine (VITAMIN B1) tablet 100 mg  100 mg Oral Daily Myriam Forehand, NP   100 mg at 06/29/23 3295   traZODone (DESYREL) tablet 50 mg  50 mg Oral QHS PRN Jearld Lesch, NP       PTA Medications: Medications Prior to Admission  Medication Sig Dispense Refill Last Dose   metoprolol succinate (TOPROL-XL) 25 MG 24 hr tablet Take 1 tablet (25 mg total) by mouth daily. Take with or immediately following a meal. 90 tablet 3    Multiple Vitamin (MULTI VITAMIN MENS PO) Take by mouth daily.       Patient Stressors: Financial difficulties   Marital or family conflict   Substance abuse    Patient Strengths: Ability for Licensed conveyancer fund of knowledge  Motivation for treatment/growth  Supportive family/friends  Work skills   Treatment Modalities: Medication Management, Group therapy, Case management,  1 to 1 session with clinician, Psychoeducation, Recreational therapy.   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) Long Term Goal(s): Improvement in symptoms so as ready for discharge   Short Term Goals: Ability to identify changes in lifestyle to reduce recurrence of condition will improve Ability to verbalize feelings will improve Ability to disclose and discuss suicidal ideas Ability to demonstrate self-control will improve Ability to identify and develop effective coping behaviors will improve Ability to maintain clinical measurements within normal limits will improve Compliance with prescribed medications will improve Ability to identify triggers associated with substance abuse/mental health issues will  improve  Medication Management: Evaluate patient's response, side effects, and tolerance of medication regimen.  Therapeutic Interventions: 1 to 1 sessions, Unit Group sessions and Medication administration.  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)  Long Term Goal(s): Improvement in symptoms so as ready for discharge   Short Term Goals: Ability to identify changes in lifestyle to reduce recurrence of condition will improve Ability to verbalize feelings will improve Ability to disclose and discuss suicidal ideas Ability to demonstrate self-control will improve Ability to identify and develop effective coping behaviors will improve Ability to maintain clinical measurements within normal limits will improve Compliance with prescribed medications will improve Ability to identify triggers associated with substance abuse/mental health issues will improve     Medication Management: Evaluate patient's response, side effects, and tolerance of medication regimen.  Therapeutic Interventions: 1 to 1 sessions, Unit Group sessions and Medication administration.  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) Long Term Goal(s): Knowledge of disease and therapeutic regimen to maintain health will improve  Short Term Goals: Ability to remain free from injury will improve, Ability to verbalize frustration and anger appropriately will improve, Ability to demonstrate self-control, Ability to participate in decision making will improve, Ability to verbalize feelings will improve, Ability to disclose and discuss suicidal ideas, Ability to identify and develop effective coping behaviors will improve, and Compliance with prescribed medications will  improve  Medication Management: RN will administer medications as ordered by provider, will assess and evaluate patient's response and provide education to patient for prescribed medication. RN will report any adverse and/or side effects to prescribing provider.  Therapeutic Interventions: 1 on 1 counseling sessions, Psychoeducation, Medication administration, Evaluate responses to treatment, Monitor vital signs and CBGs as ordered, Perform/monitor CIWA, COWS, AIMS and Fall Risk screenings as ordered, Perform wound care treatments as ordered.  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  Short Term Goals: Engage patient in aftercare planning with referrals and resources, Increase social support, Increase ability to appropriately verbalize feelings, Increase emotional regulation, Facilitate acceptance of mental health diagnosis and concerns, Facilitate patient progression through stages of change regarding substance use diagnoses and concerns, Identify triggers associated with mental health/substance abuse issues, and Increase skills for wellness and recovery  Therapeutic Interventions: Assess for all discharge needs, 1 to 1 time with Social worker, Explore available resources  and support systems, Assess for adequacy in community support network, Educate family and significant other(s) on suicide prevention, Complete Psychosocial Assessment, Interpersonal group therapy.  Evaluation of Outcomes: Progressing   Progress in Treatment: Attending groups: Yes. Participating in groups: Yes. Taking medication as prescribed: Yes. Toleration medication: Yes. Family/Significant other contact made: No, will contact:  wife, Dalonta Biernacki.   Patient understands diagnosis: Yes. Discussing patient identified problems/goals with staff: Yes. Medical problems stabilized or resolved: Yes. Denies suicidal/homicidal ideation: Yes. Issues/concerns per patient self-inventory: No. Other: none.  New problem(s) identified: No, Describe:  none identified.  New Short Term/Long Term Goal(s): detox, elimination of symptoms of psychosis, medication management for mood stabilization; elimination of SI thoughts; development of comprehensive mental wellness/sobriety plan.  Patient Goals:  "Reflect on what happened and not let it happen again."  Discharge Plan or Barriers: CSW will assist pt with development of an appropriate aftercare/discharge plan.   Reason for Continuation of Hospitalization: Depression Medication stabilization Suicidal ideation Withdrawal symptoms  Estimated Length of Stay: 1-7 days  Last 3 [place] Suicide Severity Risk Score: Flowsheet Row Admission (Current) from 06/27/2023 in [facility] Most recent reading at 06/27/2023  6:00 PM ED [facility]27/[facility] Most recent reading at 06/26/2023 10:27 PM ED from 05/16/2023 in [facility] Emergency Department at [facility] Most recent reading at 05/16/2023  4:41 PM  C-SSRS RISK CATEGORY Low Risk High Risk No Risk       Last PHQ 2/9 Scores:     No data to display          Scribe for Treatment Team: Glenis Smoker, LCSW 06/29/2023 10:03 AM

## 2023-06-29 NOTE — Group Note (Signed)
Date:  06/29/2023 Time:  3:26 PM  Group Topic/Focus:  Healthy Communication:   The focus of this group is to discuss communication, barriers to communication, as well as healthy ways to communicate with others.    Participation Level:  Active  Participation Quality:  Appropriate  Affect:  Appropriate  Cognitive:  Appropriate  Insight: Appropriate  Engagement in Group:  Engaged  Modes of Intervention:  Activity  Additional Comments:    Mary Sella Bliss Tsang 06/29/2023, 3:26 PM

## 2023-06-30 MED ORDER — METOPROLOL SUCCINATE ER 25 MG PO TB24: 25.0000 mg | ORAL_TABLET | Freq: Every day | ORAL | 0 refills | Status: DC

## 2023-06-30 MED ORDER — ESCITALOPRAM OXALATE 5 MG PO TABS: 5.0000 mg | ORAL_TABLET | Freq: Every day | ORAL | 0 refills | Status: DC

## 2023-06-30 MED ORDER — VITAMIN B-1 100 MG PO TABS: 100.0000 mg | ORAL_TABLET | Freq: Every day | ORAL | 0 refills | Status: AC

## 2023-06-30 MED ORDER — ADULT MULTIVITAMIN W/MINERALS CH: 1.0000 | ORAL_TABLET | Freq: Every day | ORAL | 0 refills | Status: AC

## 2023-06-30 NOTE — Discharge Summary (Addendum)
Physician Discharge Summary Note  Patient:  Henry Garcia is an 27 y.o., male MRN:  [id number] DOB:  August 07, 1995 Patient phone:  [phone] (home)  Patient address:   [address] [place] [address],  Total Time spent with patient: 1.5 hours  Date of Admission:  06/27/2023 Date of Discharge: 06/30/2023  Reason for Admission:   27 year old Hispanic male, was admitted under involuntary commitment (IVC) after making suicidal threats during a phone call with his wife while intoxicated. His wife contacted 911, expressing concern about his safety and reporting that he had previously attempted suicide and displayed emotional volatility during arguments.The patient admitted to heavy alcohol consumption starting earlier in the evening, stating he was attempting to manage stress but "drank too much." At the time of admission, his blood alcohol level (BAL) was 218 mg/dL, indicating significant intoxication. Collateral information provided by his wife highlighted a history of suicidal ideation, past suicide attempt involving a firearm (March 2023), and ongoing issues with anger and alcohol use. The patient denied current suicidal ideation, homicidal ideation, or hallucinations during the evaluation but minimized the severity of the events leading to his admission.Admission was warranted to address acute safety concerns, initiate monitoring for alcohol withdrawal symptoms, stabilize mood, and provide necessary interventions to mitigate risks associated with his ongoing mental health and substance use challenges.  Principal Problem: Alcohol dependence (HCC) Discharge Diagnoses: Principal Problem:   Alcohol dependence (HCC) Active Problems:   Adjustment disorder with mixed disturbance of emotions and conduct   Past Psychiatric History: none prior to admission  Past Medical History:  Past Medical History:  Diagnosis Date   Heart murmur    Heart murmur    Wolff-Parkinson-White syndrome      Past Surgical History:  Procedure Laterality Date   ELECTROPHYSIOLOGIC STUDY     08/2018   HAND SURGERY Right    5th finger, 2012 ad 2013   TONSILLECTOMY     Family History:  Family History  Problem Relation Age of Onset   Hyperlipidemia Father    Family Psychiatric  History: none reported Social History:  Social History   Substance and Sexual Activity  Alcohol Use Yes   Alcohol/week: 21.0 standard drinks of alcohol   Types: 21 Shots of liquor per week     Social History   Substance and Sexual Activity  Drug Use No    Social History   Socioeconomic History   Marital status: Married    Spouse name: Not on file   Number of children: Not on file   Years of education: Not on file   Highest education level: Not on file  Occupational History   Not on file  Tobacco Use   Smoking status: Never   Smokeless tobacco: Never  Vaping Use   Vaping status: Never Used  Substance and Sexual Activity   Alcohol use: Yes    Alcohol/week: 21.0 standard drinks of alcohol    Types: 21 Shots of liquor per week   Drug use: No   Sexual activity: Not on file  Other Topics Concern   Not on file  Social History Narrative   Not on file   Social Drivers of Health   Financial Resource Strain: Not on file  Food Insecurity: No Food Insecurity (06/27/2023)   Hunger Vital Sign    Worried About Running Out of Food in the Last Year: Never true    Ran Out of Food in the Last Year: Never true  Transportation Needs: No Transportation Needs (06/27/2023)  PRAPARE - Administrator, Civil Service (Medical): No    Lack of Transportation (Non-Medical): No  Physical Activity: Not on file  Stress: Not on file  Social Connections: Not on file    Hospital Course:  27 year old Hispanic male, was admitted under involuntary commitment (IVC) after making suicidal threats during a phone call with his wife while intoxicated. His admission followed significant concerns about his safety,  emotional volatility, and a history of a prior suicide attempt involving a firearm in March 2023. Upon arrival, his blood alcohol level (BAL) was 218 mg/dL, and he reported heavy alcohol use starting earlier in the evening.During his hospitalization, the following interventions were undertaken:The patient was placed on the [facility] Withdrawal Assessment (CIWA) protocol to monitor and manage symptoms of alcohol withdrawal.He completed the CIWA protocol without complications.Gabapentin 100 mg TID was initiated to address anxiety and mitigate withdrawal symptoms. Lexapro (Escitalopram) 5 mg daily was started for mood stabilization and management of depressive symptoms.Daily evaluations were conducted to monitor his emotional state, thought process, and risk for self-harm.The patient engaged in supportive therapy sessions focused on addressing stress management, alcohol use, and anger.Collateral information provided by the patient's wife emphasized ongoing concerns about his alcohol use, emotional instability, and threats of self-harm.The patient and his wife were educated on the importance of outpatient therapy and substance use treatment.The patient was observed to be calm and cooperative during his stay but displayed minimal eye contact and a tendency to minimize the events leading to his admission.He denied current suicidal ideation (SI), homicidal ideation (HI), and hallucinations throughout his hospitalization.The patient was deemed clinically stable for discharge after successfully completing withdrawal management and demonstrating an understanding of the importance of follow-up care.A telehealth follow-up appointment was scheduled with [facility] for 07/08/2023 at 8:00AM.Crisis resources were provided, including the National Suicide Prevention Lifeline and local support services for substance use and mental health.The patient was discharged in stable condition. He denied current SI, HI,  or hallucinations, and his wife confirmed her support for his transition to outpatient care.Continue prescribed medications as outlined.Engage in follow-up care with [facility]. Utilize crisis resources if necessary and adhere to the safety plan discussed during his hospitalization. This hospital course highlights the clinical interventions, stabilization efforts, and discharge planning undertaken to support the patient's recovery and ensure continuity of care.  Physical Findings: AIMS:  , ,  ,  ,    CIWA:  CIWA-Ar Total: 0   Musculoskeletal: Strength & Muscle Tone: within normal limits Gait & Station: normal Patient leans: N/A   Psychiatric Specialty Exam:  Presentation  General Appearance:  Appropriate for Environment; Neat  Eye Contact: Good  Speech: Normal Rate; Clear and Coherent  Speech Volume: Normal  Handedness: Right   Mood and Affect  Mood: Euthymic  Affect: Appropriate; Congruent   Thought Process  Thought Processes: Coherent  Descriptions of Associations:Intact  Orientation:Full (Time, Place and Person) (and situation)  Thought Content:WDL  History of Schizophrenia/Schizoaffective disorder:No  Duration of Psychotic Symptoms:none recorded Hallucinations:Hallucinations: None  Ideas of Reference:None  Suicidal Thoughts:Suicidal Thoughts: No  Homicidal Thoughts:Homicidal Thoughts: No   Sensorium  Memory: Immediate Good; Remote Good  Judgment: Good  Insight: Fair   Executive Functions  Concentration: Good  Attention Span: Good  Recall: Good  Fund of Knowledge: Good  Language: Good   Psychomotor Activity  Psychomotor Activity: Psychomotor Activity: Normal   Assets  Assets: Communication Skills; Financial Resources/Insurance; Housing; Social Support   Sleep  Sleep: Sleep: Good Number  of Hours of Sleep: 7    Physical Exam: Physical Exam Vitals and nursing note reviewed.  Constitutional:       Appearance: Normal appearance.  HENT:     Head: Normocephalic and atraumatic.     Nose: Nose normal.  Pulmonary:     Effort: Pulmonary effort is normal.  Musculoskeletal:        General: Normal range of motion.     Cervical back: Normal range of motion.  Neurological:     General: No focal deficit present.     Mental Status: He is alert and oriented to person, place, and time. Mental status is at baseline.  Psychiatric:        Attention and Perception: Attention and perception normal.        Mood and Affect: Mood and affect normal.        Speech: Speech normal.        Behavior: Behavior normal. Behavior is cooperative.        Thought Content: Thought content normal.        Cognition and Memory: Cognition and memory normal.        Judgment: Judgment normal.    Review of Systems  All other systems reviewed and are negative.  Blood pressure 102/62, pulse 71, temperature 98 F (36.7 C), temperature source Oral, resp. rate 20, height 5\' 5"  (1.651 m), weight 59.9 kg, SpO2 98%. Body mass index is 21.97 kg/m.   Social History   Tobacco Use  Smoking Status Never  Smokeless Tobacco Never   Tobacco Cessation:  N/A, patient does not currently use tobacco products   Blood Alcohol level:  Lab Results  Component Value Date   ETH 218 (H) 06/26/2023   ETH 223 (H) 12/02/2022    Metabolic Disorder Labs:  No results found for: "HGBA1C", "MPG" No results found for: "PROLACTIN" No results found for: "CHOL", "TRIG", "HDL", "CHOLHDL", "VLDL", "LDLCALC"  See Psychiatric Specialty Exam and Suicide Risk Assessment completed by Attending Physician prior to discharge.  Discharge destination:  Home  Is patient on multiple antipsychotic therapies at discharge:  No   Has Patient had three or more failed trials of antipsychotic monotherapy by history:  No  Recommended Plan for Multiple Antipsychotic Therapies: NA   Allergies as of 06/30/2023       Reactions   Cortisone Other (See  Comments), Shortness Of Breath   hiccups   Amoxicillin Itching   Took in combination with clarithromycin and omeprazole for h. Pylori. Developed urticaria in 4 days   Clarithromycin Itching   Took in combination with amoxicillin for h pylori. Developed rash        Medication List     TAKE these medications      Indication  escitalopram 5 MG tablet Commonly known as: LEXAPRO Take 1 tablet (5 mg total) by mouth daily. Start taking on: July 01, 2023  Indication: Major Depressive Disorder   metoprolol succinate 25 MG 24 hr tablet Commonly known as: TOPROL-XL Take 1 tablet (25 mg total) by mouth daily. Start taking on: July 01, 2023 What changed: additional instructions  Indication: High Blood Pressure   multivitamin with minerals Tabs tablet Take 1 tablet by mouth daily. Start taking on: July 01, 2023 What changed: how much to take  Indication: supplement   thiamine 100 MG tablet Commonly known as: Vitamin B-1 Take 1 tablet (100 mg total) by mouth daily. Start taking on: July 01, 2023  Indication: Deficiency of Vitamin B1  Follow-up Information     [facility]. Go to.   Why: Telehealth appointment scheduled 07/08/23 at 8 AM. Contact information: [address]  [address] [place] [address] [phone]                 Follow-up recommendations:  Activity:  as tolerated Diet:  heart healthy  Comments:   Lexapro (Escitalopram) 5 mg daily: For mood stabilization and management of depressive symptoms. National Suicide Prevention Lifeline: Dial 988 or visit www.NewsActor.se. Crisis Text Line: Text HOME to 249-098-2022 for confidential 24/7 support. Outpatient follow-up with a psychiatrist to monitor medication efficacy and mood stabilization. Alcohol Use Disorder treatment resources provided, including AA meetings and local rehabilitation centers.  [facility] Reason for Follow-Up: Telehealth appointment for ongoing  mental health support and medication management.Date and Time: Scheduled for 07/08/2023 at 8:00 AM Address: [address], [address], [place], [place] [address] Phone: [phone]  Signed: Myriam Forehand, NP 06/30/2023, 3:34 PM

## 2023-06-30 NOTE — Progress Notes (Signed)
   06/30/23 1100  Psych Admission Type (Psych Patients Only)  Admission Status Involuntary  Psychosocial Assessment  Patient Complaints None  Eye Contact Brief  Facial Expression Flat  Affect Flat  Speech Logical/coherent  Interaction Assertive  Motor Activity Slow  Appearance/Hygiene Unremarkable  Behavior Characteristics Cooperative  Mood Pleasant  Thought Process  Coherency WDL  Content WDL  Delusions None reported or observed  Perception WDL  Hallucination None reported or observed  Judgment Impaired  Confusion None  Danger to Self  Current suicidal ideation? Denies  Agreement Not to Harm Self Yes  Description of Agreement verbal  Danger to Others  Danger to Others None reported or observed   Patient discharged at this time in care of family. All discharge instructions and resources given to patient with acknowledgement. Denies SI/HI/AVH

## 2023-06-30 NOTE — Group Note (Signed)
[facility] LCSW Group Therapy Note   Group Date: 06/30/2023 Start Time: 1300 End Time: 1400   Type of Therapy/Topic:  Group Therapy:  Balance in Life  Participation Level:  Active   Description of Group:    This group will address the concept of balance and how it feels and looks when one is unbalanced. Patients will be encouraged to process areas in their lives that are out of balance, and identify reasons for remaining unbalanced. Facilitators will guide patients utilizing problem- solving interventions to address and correct the stressor making their life unbalanced. Understanding and applying boundaries will be explored and addressed for obtaining  and maintaining a balanced life. Patients will be encouraged to explore ways to assertively make their unbalanced needs known to significant others in their lives, using other group members and facilitator for support and feedback.  Therapeutic Goals: Patient will identify two or more emotions or situations they have that consume much of in their lives. Patient will identify signs/triggers that life has become out of balance:  Patient will identify two ways to set boundaries in order to achieve balance in their lives:  Patient will demonstrate ability to communicate their needs through discussion and/or role plays  Summary of Patient Progress: Patient was present for the entirety of the group process. He shared that finances were something that caused him to become overwhelmed but also acknowledges that drinking made it no better. He spoke about spirituality and how this can be helpful in finding balance as well. Pt was engaged in the conversation. He appears to have some insight into the topic and himself. Pt was open and receptive to feedback/comments from both his peers and the facilitator.    Therapeutic Modalities:   Cognitive Behavioral Therapy Solution-Focused Therapy Assertiveness Training   Glenis Smoker, LCSW

## 2023-06-30 NOTE — [facility] Suicide Risk Assessment (Signed)
[facility] Discharge Suicide Risk Assessment   Principal Problem: Alcohol dependence (HCC) Discharge Diagnoses: Principal Problem:   Alcohol dependence (HCC) Active Problems:   Adjustment disorder with mixed disturbance of emotions and conduct   Total Time spent with patient: 1 hour  Musculoskeletal: Strength & Muscle Tone: within normal limits Gait & Station: normal Patient leans: N/A  Psychiatric Specialty Exam  Presentation  General Appearance:  Appropriate for Environment; Neat  Eye Contact: Good  Speech: Normal Rate; Clear and Coherent  Speech Volume: Normal  Handedness: Right   Mood and Affect  Mood: Euthymic  Duration of Depression Symptoms: Greater than two weeks  Affect: Appropriate; Congruent   Thought Process  Thought Processes: Coherent  Descriptions of Associations:Intact  Orientation:Full (Time, Place and Person) (and situation)  Thought Content:WDL  History of Schizophrenia/Schizoaffective disorder:No  Duration of Psychotic Symptoms:none recorded Hallucinations:Hallucinations: None  Ideas of Reference:None  Suicidal Thoughts:Suicidal Thoughts: No  Homicidal Thoughts:Homicidal Thoughts: No   Sensorium  Memory: Immediate Good; Remote Good  Judgment: Good  Insight: Fair   Executive Functions  Concentration: Good  Attention Span: Good  Recall: Good  Fund of Knowledge: Good  Language: Good   Psychomotor Activity  Psychomotor Activity: Psychomotor Activity: Normal   Assets  Assets: Communication Skills; Financial Resources/Insurance; Housing; Social Support   Sleep  Sleep: Sleep: Good Number of Hours of Sleep: 7   Physical Exam: Physical Exam Vitals and nursing note reviewed.  Constitutional:      Appearance: Normal appearance.  HENT:     Head: Normocephalic and atraumatic.     Nose: Nose normal.  Pulmonary:     Effort: Pulmonary effort is normal.  Musculoskeletal:        General: Normal range  of motion.     Cervical back: Normal range of motion.  Neurological:     General: No focal deficit present.     Mental Status: He is alert and oriented to person, place, and time. Mental status is at baseline.  Psychiatric:        Attention and Perception: Attention and perception normal.        Mood and Affect: Mood and affect normal.        Speech: Speech normal.        Behavior: Behavior normal. Behavior is cooperative.        Thought Content: Thought content normal.        Cognition and Memory: Cognition and memory normal.    Review of Systems  All other systems reviewed and are negative.  Blood pressure 102/62, pulse 71, temperature 98 F (36.7 C), temperature source Oral, resp. rate 20, height 5\' 5"  (1.651 m), weight 59.9 kg, SpO2 98%. Body mass index is 21.97 kg/m.  Mental Status Per Nursing Assessment::   On Admission:  NA  Demographic Factors:  Male, Adolescent or young adult, and Low socioeconomic status  Loss Factors: Loss of significant relationship and Financial problems/change in socioeconomic status  Historical Factors: Impulsivity  Risk Reduction Factors:   Responsible for children under 10 years of age, Sense of responsibility to family, Employed, Living with another person, especially a relative, Positive social support, Positive therapeutic relationship, and Positive coping skills or problem solving skills  Continued Clinical Symptoms:  Alcohol/Substance Abuse/Dependencies  Cognitive Features That Contribute To Risk:  None    Suicide Risk:  Minimal: No identifiable suicidal ideation.  Patients presenting with no risk factors but with morbid ruminations; may be classified as minimal risk based on the severity of the depressive  symptoms   Follow-up Information     [facility]. Go to.   Why: Telehealth appointment scheduled 07/08/23 at 8 AM. Contact information: [address]  [address] [place] [address] [phone]                  Plan Of Care/Follow-up recommendations:  Activity:  as tolerated Diet:  heart healthy Lexapro (Escitalopram) 5 mg daily: For mood stabilization and management of depressive symptoms. National Suicide Prevention Lifeline: Dial 988 or visit www.NewsActor.se. Crisis Text Line: Text HOME to 609-544-9506 for confidential 24/7 support. Outpatient follow-up with a psychiatrist to monitor medication efficacy and mood stabilization. Alcohol Use Disorder treatment resources provided, including AA meetings and local rehabilitation centers.  [facility] Reason for Follow-Up: Telehealth appointment for ongoing mental health support and medication management.Date and Time: Scheduled for 07/08/2023 at 8:00 AM Address: [address], [place], [place] [address] Phone: [phone] Myriam Forehand, NP 06/30/2023, 3:18 PM

## 2023-06-30 NOTE — Group Note (Signed)
Date:  06/30/2023 Time:  11:25 AM  Group Topic/Focus:  Goals Group:   The focus of this group is to help patients establish daily goals to achieve during treatment and discuss how the patient can incorporate goal setting into their daily lives to aide in recovery.  Participation Level:  Active  Participation Quality:  Appropriate  Affect:  Appropriate  Cognitive:  Appropriate  Insight: Appropriate  Engagement in Group:  Engaged  Modes of Intervention:  Discussion and Education  Additional Comments:    Henry Garcia [name] 06/30/2023, 11:25 AM

## 2023-06-30 NOTE — Plan of Care (Signed)
Pt denies SI/HI/AVH, compliant with procedures on the unit  Problem: Education: Goal: Knowledge of [facility] Education information/materials will improve 06/30/2023 0504 by Elmyra Ricks, RN Outcome: Progressing 06/30/2023 0504 by Elmyra Ricks, RN Outcome: Progressing Goal: Emotional status will improve 06/30/2023 0504 by Elmyra Ricks, RN Outcome: Progressing 06/30/2023 0504 by Elmyra Ricks, RN Outcome: Progressing Goal: Mental status will improve 06/30/2023 0504 by Elmyra Ricks, RN Outcome: Progressing 06/30/2023 0504 by Elmyra Ricks, RN Outcome: Progressing Goal: Verbalization of understanding the information provided will improve 06/30/2023 0504 by Elmyra Ricks, RN Outcome: Progressing 06/30/2023 0504 by Elmyra Ricks, RN Outcome: Progressing   Problem: Activity: Goal: Interest or engagement in activities will improve 06/30/2023 0504 by Elmyra Ricks, RN Outcome: Progressing 06/30/2023 0504 by Elmyra Ricks, RN Outcome: Progressing Goal: Sleeping patterns will improve 06/30/2023 0504 by Elmyra Ricks, RN Outcome: Progressing 06/30/2023 0504 by Elmyra Ricks, RN Outcome: Progressing   Problem: Coping: Goal: Ability to verbalize frustrations and anger appropriately will improve 06/30/2023 0504 by Elmyra Ricks, RN Outcome: Progressing 06/30/2023 0504 by Elmyra Ricks, RN Outcome: Progressing Goal: Ability to demonstrate self-control will improve 06/30/2023 0504 by Elmyra Ricks, RN Outcome: Progressing 06/30/2023 0504 by Elmyra Ricks, RN Outcome: Progressing   Problem: Health Behavior/Discharge Planning: Goal: Identification of resources available to assist in meeting health care needs will improve 06/30/2023 0504 by Elmyra Ricks, RN Outcome: Progressing 06/30/2023 0504 by Elmyra Ricks, RN Outcome: Progressing Goal: Compliance with treatment plan for  underlying cause of condition will improve 06/30/2023 0504 by Elmyra Ricks, RN Outcome: Progressing 06/30/2023 0504 by Elmyra Ricks, RN Outcome: Progressing   Problem: Physical Regulation: Goal: Ability to maintain clinical measurements within normal limits will improve 06/30/2023 0504 by Elmyra Ricks, RN Outcome: Progressing 06/30/2023 0504 by Elmyra Ricks, RN Outcome: Progressing   Problem: Safety: Goal: Periods of time without injury will increase 06/30/2023 0504 by Elmyra Ricks, RN Outcome: Progressing 06/30/2023 0504 by Elmyra Ricks, RN Outcome: Progressing   Problem: Physical Regulation: Goal: Complications related to the disease process, condition or treatment will be avoided or minimized 06/30/2023 0504 by Elmyra Ricks, RN Outcome: Progressing 06/30/2023 0504 by Elmyra Ricks, RN Outcome: Progressing   Problem: Safety: Goal: Ability to remain free from injury will improve 06/30/2023 0504 by Elmyra Ricks, RN Outcome: Progressing 06/30/2023 0504 by Elmyra Ricks, RN Outcome: Progressing

## 2023-06-30 NOTE — Progress Notes (Signed)
  [facility] Adult Case Management Discharge Plan :  Will you be returning to the same living situation after discharge:  Yes,  Patient to return home.  At discharge, do you have transportation home?: Yes,  Patient to be transported by family.  Do you have the ability to pay for your medications: Yes, [employer]   Release of information consent forms completed and in the chart;  Patient's signature needed at discharge.  Patient to Follow up at:  Follow-up Information     [facility]. Go to.   Why: Telehealth appointment scheduled 07/08/23 at 8 AM. Contact information: [address]  [address] [place] [address] [phone]                 Next level of care provider has access to [facility] Link:no  Safety Planning and Suicide Prevention discussed: Yes,  SPE conducted with patient's wife with patient's consent. SPE material given at discharge.      Has patient been referred to the Quitline?: Patient does not use tobacco/nicotine products  Patient has been referred for addiction treatment: Yes, the patient will follow up with an outpatient provider for substance use disorder. Psychiatrist/APP: appointment made  Lowry Ram, LCSW 06/30/2023, 9:22 AM

## 2023-06-30 NOTE — [facility] Suicide Risk Assessment (Signed)
[facility] INPATIENT:  Family/Significant Other Suicide Prevention Education  Suicide Prevention Education:  Education Completed; Norlan Kapral, wife, [phone], has been identified by the patient as the family member/significant other with whom the patient will be residing, and identified as the person(s) who will aid the patient in the event of a mental health crisis (suicidal ideations/suicide attempt).  With written consent from the patient, the family member/significant other has been provided the following suicide prevention education, prior to the and/or following the discharge of the patient.  The suicide prevention education provided includes the following: Suicide risk factors Suicide prevention and interventions National Suicide Hotline telephone number [facility] assessment telephone number [facility] Emergency Assistance 911 [facility] and/or Residential Mobile Crisis Unit telephone number  Request made of family/significant other to: Remove weapons (e.g., guns, rifles, knives), all items previously/currently identified as safety concern.   Remove drugs/medications (over-the-counter, prescriptions, illicit drugs), all items previously/currently identified as a safety concern.  The family member/significant other verbalizes understanding of the suicide prevention education information provided.  The family member/significant other agrees to remove the items of safety concern listed above.  Lowry Ram 06/30/2023, 9:15 AM

## 2023-07-11 ENCOUNTER — Emergency Department: Payer: MEDICAID

## 2023-07-11 ENCOUNTER — Emergency Department
Admission: EM | Admit: 2023-07-11 | Discharge: 2023-07-11 | Disposition: A | Discharge status: Home / Self Care | Payer: MEDICAID | Attending: Emergency Medicine | Admitting: Emergency Medicine

## 2023-07-11 ENCOUNTER — Other Ambulatory Visit: Payer: Self-pay

## 2023-07-11 ENCOUNTER — Encounter: Payer: Self-pay | Admitting: Emergency Medicine

## 2023-07-11 DIAGNOSIS — R0602 Shortness of breath: Secondary | ICD-10-CM | POA: Diagnosis not present

## 2023-07-11 DIAGNOSIS — R42 Dizziness and giddiness: Secondary | ICD-10-CM | POA: Diagnosis not present

## 2023-07-11 DIAGNOSIS — R079 Chest pain, unspecified: Secondary | ICD-10-CM

## 2023-07-11 DIAGNOSIS — R0789 Other chest pain: Secondary | ICD-10-CM | POA: Insufficient documentation

## 2023-07-11 LAB — TROPONIN I (HIGH SENSITIVITY)
Troponin I (High Sensitivity): <2 ng/L (ref <18)
Troponin I (High Sensitivity): <2 ng/L (ref <18)

## 2023-07-11 LAB — CBC
HCT: 39.4 % (ref 39.0–52.0)
Hemoglobin: 13.4 g/dL (ref 13.0–17.0)
MCH: 29.5 pg (ref 26.0–34.0)
MCHC: 34 g/dL (ref 30.0–36.0)
MCV: 86.6 fL (ref 80.0–100.0)
Platelets: 307 10*3/uL (ref 150–400)
RBC: 4.55 MIL/uL (ref 4.22–5.81)
RDW: 12.4 % (ref 11.5–15.5)
WBC: 5.5 10*3/uL (ref 4.0–10.5)
nRBC: 0 % (ref 0.0–0.2)

## 2023-07-11 LAB — BASIC METABOLIC PANEL
Anion gap: 10 (ref 5–15)
BUN: 12 mg/dL (ref 6–20)
CO2: 22 mmol/L (ref 22–32)
Calcium: 8.7 mg/dL — ABNORMAL LOW (ref 8.9–10.3)
Chloride: 107 mmol/L (ref 98–111)
Creatinine, Ser: 0.82 mg/dL (ref 0.61–1.24)
GFR, Estimated: >60 mL/min (ref >60)
Glucose, Bld: 80 mg/dL (ref 70–99)
Potassium: 3.8 mmol/L (ref 3.5–5.1)
Sodium: 139 mmol/L (ref 135–145)

## 2023-07-11 MED ORDER — METOPROLOL TARTRATE 25 MG PO TABS
25.0000 mg | ORAL_TABLET | Freq: Once | ORAL | Status: AC
  Administered 2023-07-11: 25 mg via ORAL
  Filled 2023-07-11: qty 1

## 2023-07-11 MED ORDER — KETOROLAC TROMETHAMINE 15 MG/ML IJ SOLN
15.0000 mg | Freq: Once | INTRAMUSCULAR | Status: AC
  Administered 2023-07-11: 15 mg via INTRAVENOUS
  Filled 2023-07-11: qty 1

## 2023-07-11 NOTE — ED Triage Notes (Signed)
Pt BIB [facility] d/t chest pain. Pt sts around 7 pm he had a sudden onset of centralized chest pain while at work. CP described as sharp, constant, and non radiating. Associated with SOB and dizziness. Reports missing his metoprolol dose today.

## 2023-07-11 NOTE — ED Provider Notes (Signed)
[facility] Provider Note    Event Date/Time   First MD Initiated Contact with Patient 07/11/23 2106     (approximate)   History   Chest Pain   HPI  Henry Garcia is a 27 y.o. male   Past medical history of depression, alcohol use who presents to the emergency department with chest pain.  He was working as a Production assistant, radio at [employer] when he developed left-sided chest wall pain associated with dizziness and shortness of breath.  He continues to have some mild chest pain though the other symptoms have resolved.  It hurts with certain movements and with pressing of the chest wall.  He has no other acute medical complaints and has not had no recent illnesses and denies any respiratory infectious complaints.   External Medical Documents Reviewed: ED provider note from Oct 2024 " I reviewed the patient's care everywhere chart from [facility]. Patient in 2020 did have an ablation for possible Wolff-Parkinson-White however notes following this read that they believe the patient likely did not have Wolff-Parkinson-White and could not find a cardiac reason for the patient's symptoms. In reading the notes patient has been seen by cardiology as well as pulmonology with no clear cause for the patient's symptoms. " --Evaluation at that time in October 2024 in the emergency department for chest pain revealed no emergency findings and was ultimately discharged.      Physical Exam   Triage Vital Signs: ED Triage Vitals  Encounter Vitals Group     BP 07/11/23 2026 131/68     Systolic BP Percentile --      Diastolic BP Percentile --      Pulse Rate 07/11/23 2026 100     Resp 07/11/23 2026 15     Temp 07/11/23 2026 98.3 F (36.8 C)     Temp Source 07/11/23 2026 Oral     SpO2 07/11/23 2016 99 %     Weight 07/11/23 2023 135 lb (61.2 kg)     Height 07/11/23 2023 5\' 5"  (1.651 m)     Head Circumference --      Peak Flow --      Pain Score 07/11/23 2023 6     Pain Loc --       Pain Education --      Exclude from Growth Chart --     Most recent vital signs: Vitals:   07/11/23 2026 07/11/23 2154  BP: 131/68 132/85  Pulse: 100 84  Resp: 15   Temp: 98.3 F (36.8 C)   SpO2: 98% 100%    General: Awake, no distress.  CV:  Good peripheral perfusion.  Resp:  Normal effort.  Abd:  No distention.  Other:  Awake alert comfortable appearing.  No signs of trauma along the chest wall no crepitus but there is tenderness to palpation along the sternum and left chest wall.  Abdomen is soft and nontender palpation all quadrants.  Auscultation of the lungs shows no wheezing or focality.   ED Results / Procedures / Treatments   Labs (all labs ordered are listed, but only abnormal results are displayed) Labs Reviewed  BASIC METABOLIC PANEL - Abnormal; Notable for the following components:      Result Value   Calcium 8.7 (*)    All other components within normal limits  CBC  TROPONIN I (HIGH SENSITIVITY)  TROPONIN I (HIGH SENSITIVITY)     I ordered and reviewed the above labs they are notable for cell counts electrolytes  and serial troponins are all negative.  EKG  ED ECG REPORT I, Pilar Jarvis, the attending physician, personally viewed and interpreted this ECG.   Date: 07/11/2023  EKG Time: 2024  Rate: 105  Rhythm: sinus tachycardia  Axis: nl  Intervals:short pr  ST&T Change: no stemi    RADIOLOGY I independently reviewed and interpreted chest x-ray and I see no obvious focal consolidations or pneumothorax I also reviewed radiologist's formal read.   PROCEDURES:  Critical Care performed: No  Procedures   MEDICATIONS ORDERED IN ED: Medications  metoprolol tartrate (LOPRESSOR) tablet 25 mg (has no administration in time range)  ketorolac (TORADOL) 15 MG/ML injection 15 mg (15 mg Intravenous Given 07/11/23 2149)    IMPRESSION / MDM / ASSESSMENT AND PLAN / ED COURSE  I reviewed the triage vital signs and the nursing notes.                                 Patient's presentation is most consistent with acute presentation with potential threat to life or bodily function.  Differential diagnosis includes, but is not limited to, costochondritis, chest wall pain, ACS dissection or PE less likely   The patient is on the cardiac monitor to evaluate for evidence of arrhythmia and/or significant heart rate changes.  MDM:    Young man with chest wall pain tenderness to palpation worse with movements while working as a Production assistant, radio at a [employer] today.  Considered ACS but I think less likely given few risk factors in a young man with atypical symptoms, so troponins been flat EKG nonischemic.  Not consistent with PE dissection or other cardiopulmonary emergencies and no respiratory infectious symptoms to suggest infection.  No pneumothorax on chest x-ray.  Has been stable and comfortable in the emergency department.  Given Toradol with some relief.  Likely costochondritis or musculoskeletal pain.  Discharge.         FINAL CLINICAL IMPRESSION(S) / ED DIAGNOSES   Final diagnoses:  Nonspecific chest pain     Rx / DC Orders   ED Discharge Orders     None        Note:  This document was prepared using Dragon voice recognition software and may include unintentional dictation errors.    Pilar Jarvis, MD 07/11/23 [phone]

## 2023-07-11 NOTE — Discharge Instructions (Signed)
Fortunately your testing in the emergency department did not show any emergency conditions account for your symptoms.  Take acetaminophen 650 mg and ibuprofen 400 mg every 6 hours for pain.  Take with food.   Thank you for choosing [place] for your health care today!  Please see your primary doctor this week for a follow up appointment.   If you have any new, worsening, or unexpected symptoms call your doctor right away or come back to the emergency department for reevaluation.  It was my pleasure to care for you today.   Daneil Dan Modesto Charon, MD

## 2023-07-11 NOTE — ED Triage Notes (Signed)
EMS brings pt in from work for c/o CP

## 2023-08-15 IMAGING — CR DG CHEST 2V
1 series · 2 of 2 positions shown · non-contrast
Comparison: Chest radiograph dated 07/11/2020.

CLINICAL DATA: Chest pain.

EXAM:
CHEST - 2 VIEW

[Series 1: dg chest 2 view · 0.14mm/px · 2 of 2 slices shown]
[im 1/2]
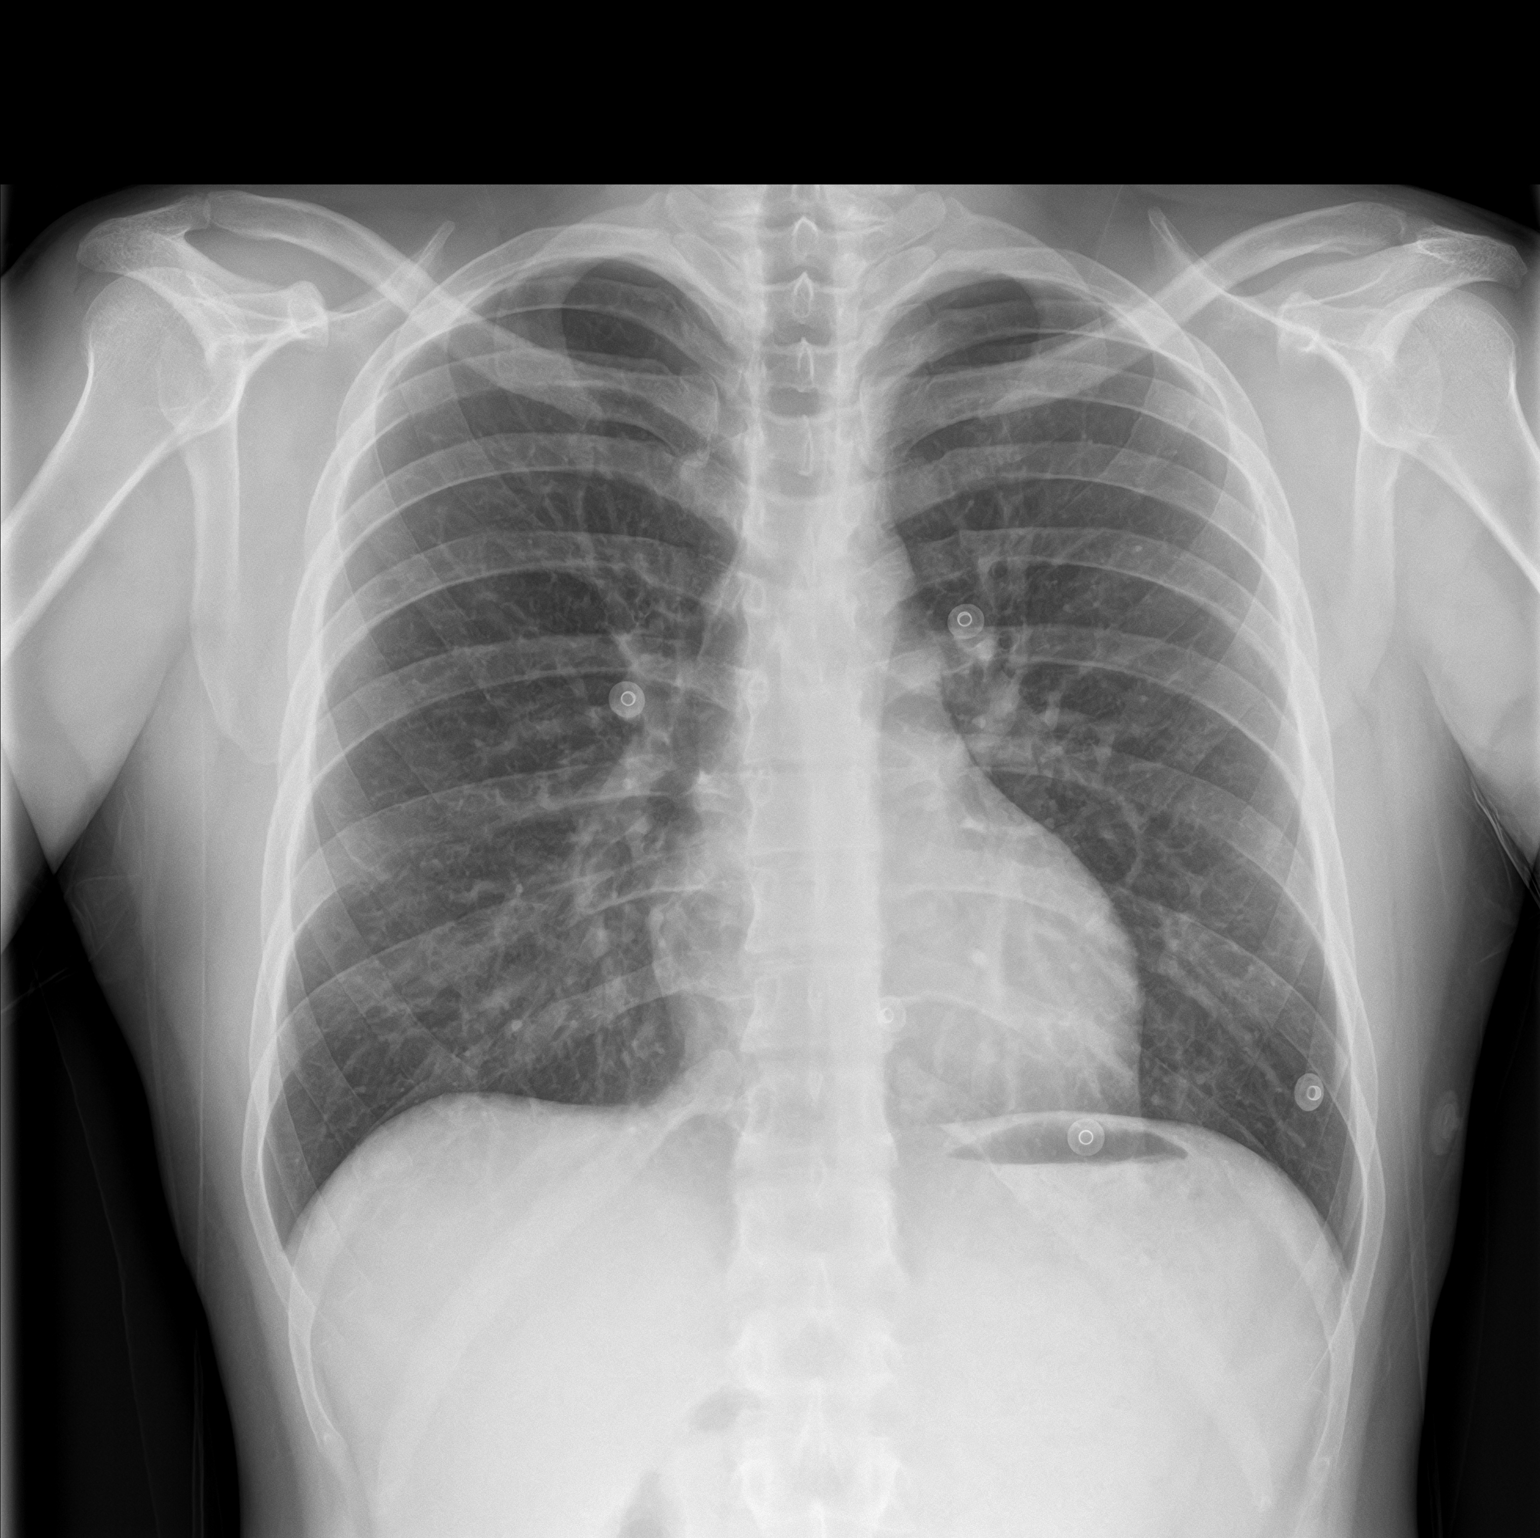
[im 2/2]
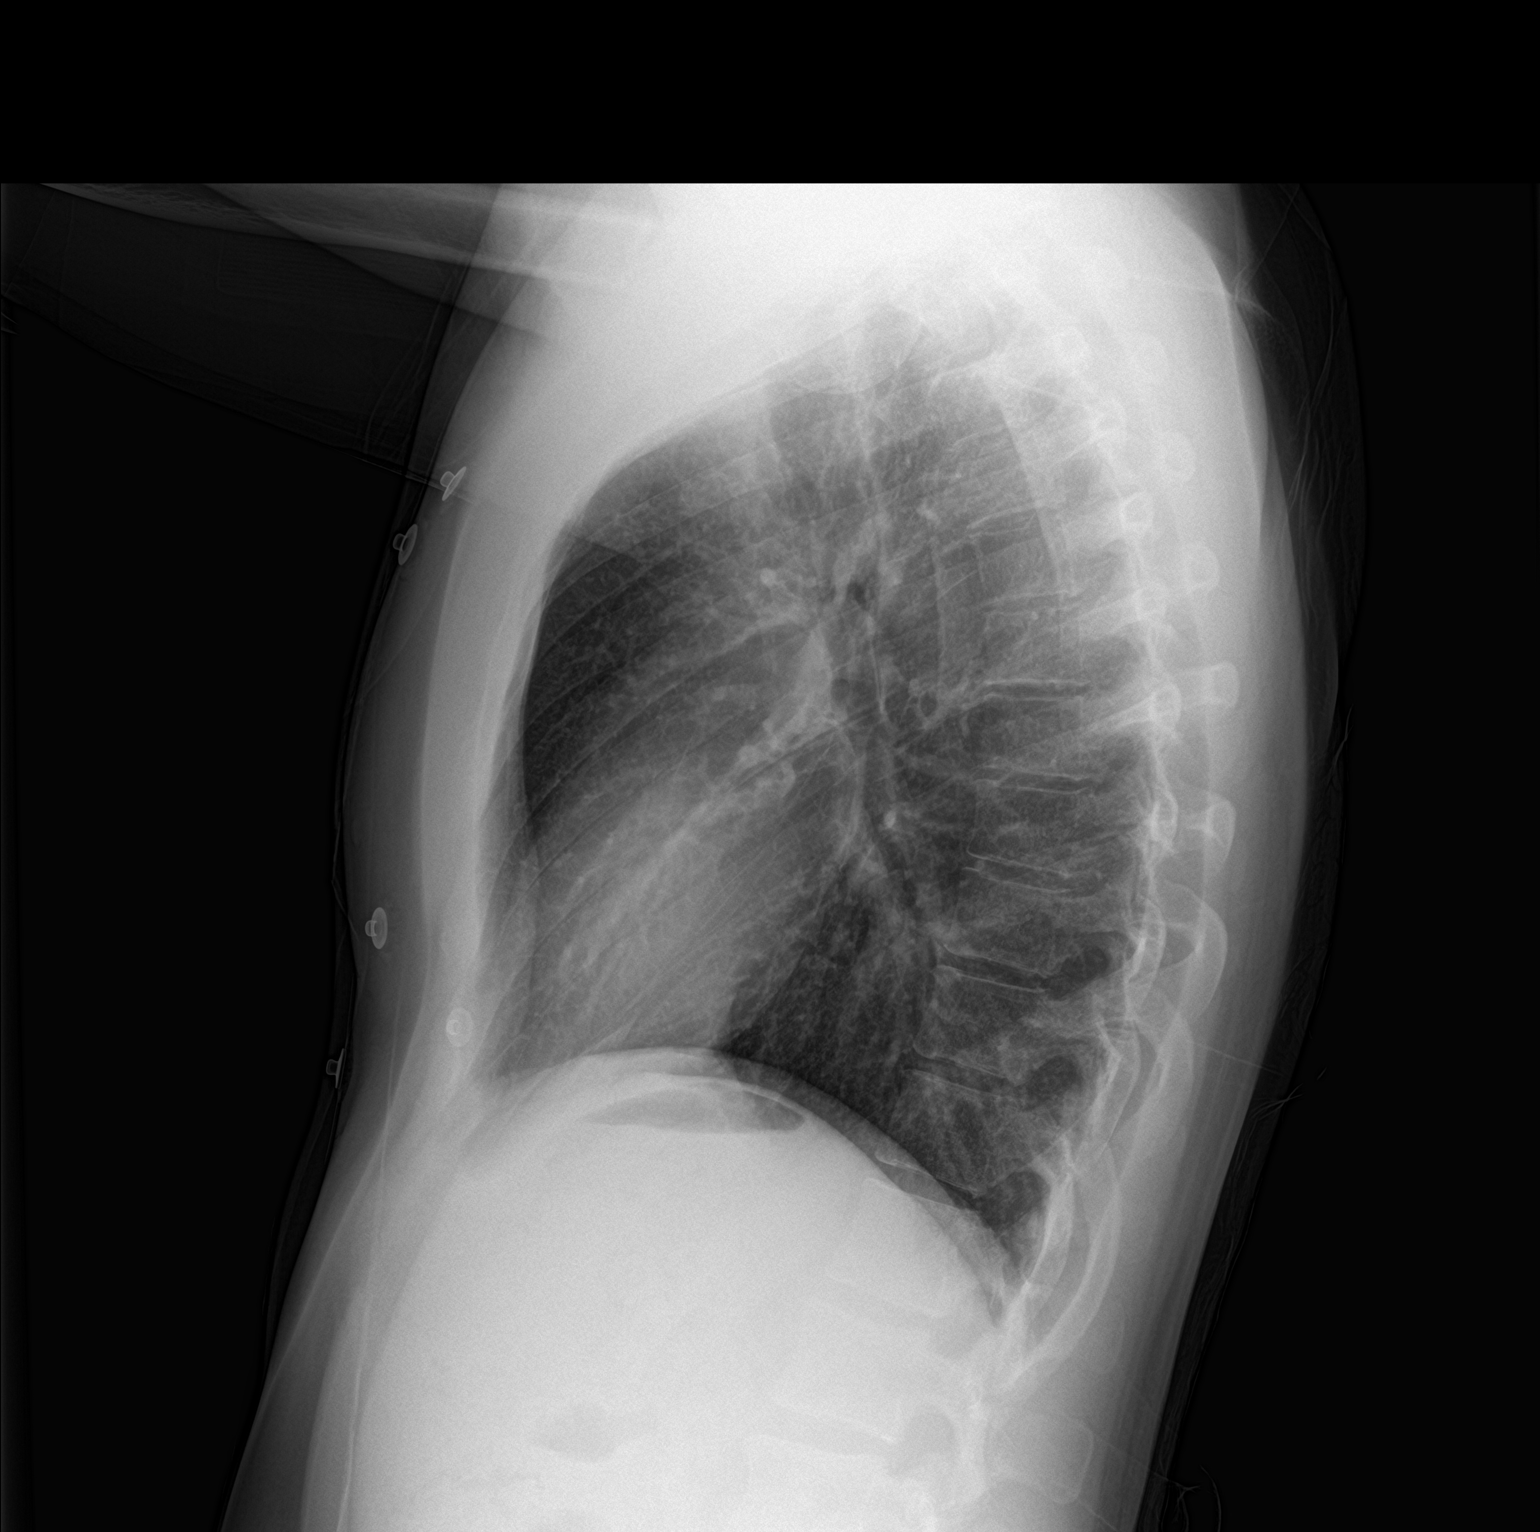

[2 of 2 positions shown; findings below may reference images not displayed]

FINDINGS: The heart size and mediastinal contours are within normal limits.
Both lungs are clear. The visualized skeletal structures are
unremarkable.
IMPRESSION: No active cardiopulmonary disease.

## 2023-08-27 ENCOUNTER — Emergency Department
Admission: EM | Admit: 2023-08-27 | Discharge: 2023-08-27 | Disposition: A | Discharge status: Home / Self Care | Payer: MEDICAID | Attending: Emergency Medicine | Admitting: Emergency Medicine

## 2023-08-27 ENCOUNTER — Other Ambulatory Visit: Payer: Self-pay

## 2023-08-27 DIAGNOSIS — F10239 Alcohol dependence with withdrawal, unspecified: Secondary | ICD-10-CM | POA: Diagnosis present

## 2023-08-27 DIAGNOSIS — Y909 Presence of alcohol in blood, level not specified: Secondary | ICD-10-CM | POA: Diagnosis not present

## 2023-08-27 DIAGNOSIS — F10930 Alcohol use, unspecified with withdrawal, uncomplicated: Secondary | ICD-10-CM

## 2023-08-27 MED ORDER — CHLORDIAZEPOXIDE HCL 25 MG PO CAPS: ORAL_CAPSULE | ORAL | 0 refills | Status: AC

## 2023-08-27 MED ORDER — CHLORDIAZEPOXIDE HCL 25 MG PO CAPS
75.0000 mg | ORAL_CAPSULE | Freq: Once | ORAL | Status: AC
  Administered 2023-08-27: 75 mg via ORAL
  Filled 2023-08-27: qty 3

## 2023-08-27 NOTE — ED Notes (Signed)
 Pt verbalizes understanding of discharge instructions.

## 2023-08-27 NOTE — ED Triage Notes (Signed)
 Has been working with therapists to wean off drinking. Last drink 2 days ago.  Had been drinking beer and whiskey daily.  Arrives today feeling shakes in hands and body shakes and body sweats.  Denies hallucinations. Calm and cooperative.

## 2023-08-27 NOTE — ED Provider Notes (Signed)
 [facility] Provider Note   Event Date/Time   First MD Initiated Contact with Patient 08/27/23 1342     (approximate) History  Delirium Tremens (DTS)  HPI Bolden Hagerman is a 28 y.o. male with a stated past medical history of alcohol abuse who states that has been trying to taper down his alcohol with last drink approximately 2 days prior to arrival and presents complaining of increasing anxiety, nausea, and tremors in bilateral upper extremities.  Patient was told by his outpatient therapist to present to the emergency department for outpatient management with medications. ROS: Patient currently denies any vision changes, tinnitus, difficulty speaking, facial droop, sore throat, chest pain, shortness of breath, abdominal pain, vomiting/diarrhea, dysuria, or weakness/numbness/paresthesias in any extremity   Physical Exam  Triage Vital Signs: ED Triage Vitals  Encounter Vitals Group     BP 08/27/23 1319 137/77     Systolic BP Percentile --      Diastolic BP Percentile --      Pulse Rate 08/27/23 1319 85     Resp 08/27/23 1319 20     Temp 08/27/23 1319 98.5 F (36.9 C)     Temp Source 08/27/23 1319 Oral     SpO2 08/27/23 1319 100 %     Weight --      Height 08/27/23 1319 5' 5 (1.651 m)     Head Circumference --      Peak Flow --      Pain Score 08/27/23 1335 0     Pain Loc --      Pain Education --      Exclude from Growth Chart --    Most recent vital signs: Vitals:   08/27/23 1319 08/27/23 1515  BP: 137/77 119/74  Pulse: 85 92  Resp: 20 16  Temp: 98.5 F (36.9 C) 98.4 F (36.9 C)  SpO2: 100% 100%   General: Awake, oriented x4. CV:  Good peripheral perfusion.  Resp:  Normal effort.  Abd:  No distention.  Other:  No tremor appreciated on exam.  Middle-age well-developed, well-nourished Hispanic male resting comfortably in no acute distress ED Results / Procedures / Treatments  Labs (all labs ordered are listed, but only abnormal results are  displayed) Labs Reviewed - No data to display  PROCEDURES: Critical Care performed: No Procedures MEDICATIONS ORDERED IN ED: Medications  chlordiazePOXIDE  (LIBRIUM ) capsule 75 mg (75 mg Oral Given 08/27/23 1352)   IMPRESSION / MDM / ASSESSMENT AND PLAN / ED COURSE  I reviewed the triage vital signs and the nursing notes.                             The patient is on the cardiac monitor to evaluate for evidence of arrhythmia and/or significant heart rate changes. Patient's presentation is most consistent with acute presentation with potential threat to life or bodily function. + recent reduction in alcohol use from chronic abuse + anxiety +nausea + tachycardia + tremor + diaphoresis Denies tactile, auditory, and visual hallucinations. AAOx3. Denies history of withdrawal seizures, ICU admissions, DTs.  Workup: Including but not limited to POCT glucose, CBC, BMP Interventions: 75 Librium  p.o.  Given History, Exam, and Workup this patient appears to be suffering from alcohol withdrawal. Presentation not consistent with infectious etiology, thyrotoxicosis, Coingestion toxidrome/withdrawal, primary psychologic disorder such as anxiety  Reassessment: Frequent exams showed improving symptoms and evidence that the patient's symptoms are secondary to a controllable withdrawal from alcohol.  Pt able to ambulate without difficulty and is PO tolerant. Plan DC home with return precautions.  Disposition: Discharge. Patient is chronic alcohol abuser with plan to attend rehabilitation at inpatient facility within next 4 days and is at risk for withdrawal. Plan Chlordiazepoxide  Taper of 21 Capsules to be taken: 50mg  TID for 48hrs followed by 25mg  TID for 48hrs then 25mg  PRN for 48hrs   FINAL CLINICAL IMPRESSION(S) / ED DIAGNOSES   Final diagnoses:  Alcohol withdrawal syndrome without complication (HCC)   Rx / DC Orders   ED Discharge Orders          Ordered    chlordiazePOXIDE  (LIBRIUM ) 25 MG  capsule  Multiple Frequencies        08/27/23 1437           Note:  This document was prepared using Dragon voice recognition software and may include unintentional dictation errors.   Austen Oyster K, MD 08/27/23 [phone]

## 2023-09-18 ENCOUNTER — Emergency Department: Payer: MEDICAID

## 2023-09-18 ENCOUNTER — Observation Stay
Admission: EM | Admit: 2023-09-18 | Discharge: 2023-09-19 | Disposition: A | Discharge status: Home / Self Care | Payer: MEDICAID | Attending: Obstetrics and Gynecology | Admitting: Obstetrics and Gynecology

## 2023-09-18 DIAGNOSIS — F1021 Alcohol dependence, in remission: Secondary | ICD-10-CM | POA: Diagnosis not present

## 2023-09-18 DIAGNOSIS — G43819 Other migraine, intractable, without status migrainosus: Secondary | ICD-10-CM | POA: Diagnosis not present

## 2023-09-18 DIAGNOSIS — G43409 Hemiplegic migraine, not intractable, without status migrainosus: Secondary | ICD-10-CM

## 2023-09-18 DIAGNOSIS — I479 Paroxysmal tachycardia, unspecified: Secondary | ICD-10-CM | POA: Diagnosis present

## 2023-09-18 DIAGNOSIS — R531 Weakness: Secondary | ICD-10-CM | POA: Diagnosis present

## 2023-09-18 DIAGNOSIS — R29818 Other symptoms and signs involving the nervous system: Secondary | ICD-10-CM | POA: Diagnosis present

## 2023-09-18 DIAGNOSIS — F32A Depression, unspecified: Secondary | ICD-10-CM | POA: Diagnosis not present

## 2023-09-18 DIAGNOSIS — G43809 Other migraine, not intractable, without status migrainosus: Secondary | ICD-10-CM

## 2023-09-18 LAB — COMPREHENSIVE METABOLIC PANEL
ALT: 14 U/L (ref 0–44)
AST: 20 U/L (ref 15–41)
Albumin: 5.1 g/dL — ABNORMAL HIGH (ref 3.5–5.0)
Alkaline Phosphatase: 75 U/L (ref 38–126)
Anion gap: 13 (ref 5–15)
BUN: 15 mg/dL (ref 6–20)
CO2: 25 mmol/L (ref 22–32)
Calcium: 9.6 mg/dL (ref 8.9–10.3)
Chloride: 99 mmol/L (ref 98–111)
Creatinine, Ser: 0.75 mg/dL (ref 0.61–1.24)
GFR, Estimated: >60 mL/min (ref >60)
Glucose, Bld: 110 mg/dL — ABNORMAL HIGH (ref 70–99)
Potassium: 3.7 mmol/L (ref 3.5–5.1)
Sodium: 137 mmol/L (ref 135–145)
Total Bilirubin: 0.9 mg/dL (ref 0.0–1.2)
Total Protein: 8.4 g/dL — ABNORMAL HIGH (ref 6.5–8.1)

## 2023-09-18 LAB — CBC
HCT: 42.1 % (ref 39.0–52.0)
Hemoglobin: 14.3 g/dL (ref 13.0–17.0)
MCH: 30.1 pg (ref 26.0–34.0)
MCHC: 34 g/dL (ref 30.0–36.0)
MCV: 88.6 fL (ref 80.0–100.0)
Platelets: 299 10*3/uL (ref 150–400)
RBC: 4.75 MIL/uL (ref 4.22–5.81)
RDW: 12.7 % (ref 11.5–15.5)
WBC: 7.7 10*3/uL (ref 4.0–10.5)
nRBC: 0 % (ref 0.0–0.2)

## 2023-09-18 LAB — DIFFERENTIAL
Abs Immature Granulocytes: 0.02 10*3/uL (ref 0.00–0.07)
Basophils Absolute: 0.1 10*3/uL (ref 0.0–0.1)
Basophils Relative: 1 %
Eosinophils Absolute: 0 10*3/uL (ref 0.0–0.5)
Eosinophils Relative: 0 %
Immature Granulocytes: 0 %
Lymphocytes Relative: 21 %
Lymphs Abs: 1.6 10*3/uL (ref 0.7–4.0)
Monocytes Absolute: 0.8 10*3/uL (ref 0.1–1.0)
Monocytes Relative: 11 %
Neutro Abs: 5.2 10*3/uL (ref 1.7–7.7)
Neutrophils Relative %: 67 %

## 2023-09-18 LAB — PROTIME-INR
INR: 1 (ref 0.8–1.2)
Prothrombin Time: 13.7 s (ref 11.4–15.2)

## 2023-09-18 LAB — ETHANOL: Alcohol, Ethyl (B): <10 mg/dL (ref <10)

## 2023-09-18 LAB — TROPONIN I (HIGH SENSITIVITY): Troponin I (High Sensitivity): 3 ng/L (ref <18)

## 2023-09-18 LAB — APTT: aPTT: 31 s (ref 24–36)

## 2023-09-18 LAB — CBG MONITORING, ED: Glucose-Capillary: 93 mg/dL (ref 70–99)

## 2023-09-18 MED ORDER — ESCITALOPRAM OXALATE 10 MG PO TABS: 5.0000 mg | ORAL_TABLET | Freq: Every day | ORAL | Status: DC

## 2023-09-18 MED ORDER — ACETAMINOPHEN 325 MG PO TABS: 650.0000 mg | ORAL_TABLET | ORAL | Status: DC | PRN

## 2023-09-18 MED ORDER — STROKE: EARLY STAGES OF RECOVERY BOOK: Freq: Once | Status: DC

## 2023-09-18 MED ORDER — IOHEXOL 350 MG/ML SOLN
100.0000 mL | Freq: Once | INTRAVENOUS | Status: AC | PRN
  Administered 2023-09-18: 100 mL via INTRAVENOUS

## 2023-09-18 MED ORDER — ACETAMINOPHEN 160 MG/5ML PO SOLN: 650.0000 mg | ORAL | Status: DC | PRN

## 2023-09-18 MED ORDER — METOPROLOL SUCCINATE ER 25 MG PO TB24
25.0000 mg | ORAL_TABLET | Freq: Every day | ORAL | Status: DC
  Administered 2023-09-19: 25 mg via ORAL
  Filled 2023-09-18: qty 1

## 2023-09-18 MED ORDER — ENOXAPARIN SODIUM 40 MG/0.4ML IJ SOSY
40.0000 mg | PREFILLED_SYRINGE | INTRAMUSCULAR | Status: DC
  Administered 2023-09-18: 40 mg via SUBCUTANEOUS
  Filled 2023-09-18: qty 0.4

## 2023-09-18 MED ORDER — ACETAMINOPHEN 325 MG RE SUPP: 650.0000 mg | RECTAL | Status: DC | PRN

## 2023-09-18 MED ORDER — ASPIRIN 81 MG PO TBEC: 81.0000 mg | DELAYED_RELEASE_TABLET | Freq: Every day | ORAL | Status: DC

## 2023-09-18 MED ORDER — TENECTEPLASE FOR STROKE
PACK | INTRAVENOUS | Status: AC
  Filled 2023-09-18: qty 10

## 2023-09-18 NOTE — ED Provider Notes (Signed)
 [facility] Provider Note    Event Date/Time   First MD Initiated Contact with Patient 09/18/23 2025     (approximate)   History   Chief Complaint Weakness   HPI  Henry Garcia is a 28 y.o. male with past medical history of alcohol abuse and Wolff-Parkinson-White who presents to the ED complaining of weakness.  Patient reports that around 7 PM this evening he had sudden onset of numbness and weakness in his left side.  He describes tingling in the left side of his face and feeling unsteady when he tries to walk.  He feels like it is harder for him to get his speech out and it is slurred when he does so.  He denies any changes in his vision, does state that he had a brief headache that has since resolved.  He additionally complains of some pressure in his chest and mild difficulty breathing that came on around the same time.  He denies any recent fevers or cough, has not had any nausea or vomiting.      Physical Exam   Triage Vital Signs: ED Triage Vitals  Encounter Vitals Group     BP 09/18/23 2003 (!) 137/92     Systolic BP Percentile --      Diastolic BP Percentile --      Pulse Rate 09/18/23 2003 74     Resp 09/18/23 2003 16     Temp 09/18/23 2003 98.4 F (36.9 C)     Temp Source 09/18/23 2003 Oral     SpO2 09/18/23 2003 100 %     Weight 09/18/23 2006 145 lb (65.8 kg)     Height 09/18/23 2006 5\' 5"  (1.651 m)     Head Circumference --      Peak Flow --      Pain Score 09/18/23 2006 3     Pain Loc --      Pain Education --      Exclude from Growth Chart --     Most recent vital signs: Vitals:   09/18/23 2115 09/18/23 2135  BP: (!) 147/89 (!) 131/94  Pulse: 94 82  Resp: 14 15  Temp:    SpO2: 100% 99%    Constitutional: Alert and oriented. Eyes: Conjunctivae are normal. Head: Atraumatic. Nose: No congestion/rhinnorhea. Mouth/Throat: Mucous membranes are moist.  Cardiovascular: Normal rate, regular rhythm. Grossly normal heart  sounds.  2+ radial pulses bilaterally. Respiratory: Normal respiratory effort.  No retractions. Lungs CTAB. Gastrointestinal: Soft and nontender. No distention. Musculoskeletal: No lower extremity tenderness nor edema.  Neurologic:  Normal speech and language.  4-5 strength in left upper and left lower extremity, 5 out of 5 strength in right upper and lower extremities.  No facial droop noted.    ED Results / Procedures / Treatments   Labs (all labs ordered are listed, but only abnormal results are displayed) Labs Reviewed  COMPREHENSIVE METABOLIC PANEL - Abnormal; Notable for the following components:      Result Value   Glucose, Bld 110 (*)    Total Protein 8.4 (*)    Albumin 5.1 (*)    All other components within normal limits  PROTIME-INR  APTT  CBC  DIFFERENTIAL  ETHANOL  CBG MONITORING, ED  CBG MONITORING, ED  I-STAT CREATININE, ED  TROPONIN I (HIGH SENSITIVITY)     EKG  ED ECG REPORT I, Chesley Noon, the attending physician, personally viewed and interpreted this ECG.   Date: 09/18/2023  EKG Time:  20:06  Rate: 75  Rhythm: normal sinus rhythm  Axis: RAD  Intervals:none  ST&T Change: None  RADIOLOGY Chest x-ray reviewed and interpreted by me with no infiltrate, edema, or effusion.  PROCEDURES:  Critical Care performed: Yes, see critical care procedure note(s)  .Critical Care  Performed by: Chesley Noon, MD Authorized by: Chesley Noon, MD   Critical care provider statement:    Critical care time (minutes):  30   Critical care time was exclusive of:  Separately billable procedures and treating other patients and teaching time   Critical care was necessary to treat or prevent imminent or life-threatening deterioration of the following conditions:  CNS failure or compromise   Critical care was time spent personally by me on the following activities:  Development of treatment plan with patient or surrogate, discussions with consultants, evaluation of  patient's response to treatment, examination of patient, ordering and review of laboratory studies, ordering and review of radiographic studies, ordering and performing treatments and interventions, pulse oximetry, re-evaluation of patient's condition and review of old charts   I assumed direction of critical care for this patient from another provider in my specialty: no     Care discussed with: admitting provider      MEDICATIONS ORDERED IN ED: Medications  tenecteplase (TNKASE) 50 MG injection for Stroke (has no administration in time range)  iohexol (OMNIPAQUE) 350 MG/ML injection 100 mL (100 mLs Intravenous Contrast Given 09/18/23 2146)     IMPRESSION / MDM / ASSESSMENT AND PLAN / ED COURSE  I reviewed the triage vital signs and the nursing notes.                              28 y.o. male with past medical history of alcohol abuse and Wolff-Parkinson-White who presents to the ED complaining of sudden onset left-sided numbness and weakness with unsteady gait and difficulty speaking about an hour prior to arrival.  Patient's presentation is most consistent with acute presentation with potential threat to life or bodily function.  Differential diagnosis includes, but is not limited to, stroke, TIA, complicated migraine, functional neurologic disorder, anemia, electrolyte abnormality, AKI, anxiety.  Patient nontoxic-appearing and in no acute distress, vital signs are unremarkable.  Given acute onset of left-sided numbness and weakness with speech difficulty, code stroke activated on arrival.  CT head is negative for acute process, patient evaluated by teleneurologist who recommends proceeding with CT angiogram.  CTA is negative for acute process, patient was offered TNK but declined.  Labs without significant anemia, leukocytosis, electrolyte abnormality, or AKI.  LFTs and coags are unremarkable.  Case discussed with hospitalist for admission for further stroke workup.      FINAL CLINICAL  IMPRESSION(S) / ED DIAGNOSES   Final diagnoses:  Left-sided weakness     Rx / DC Orders   ED Discharge Orders     None        Note:  This document was prepared using Dragon voice recognition software and may include unintentional dictation errors.   Chesley Noon, MD 09/18/23 2251

## 2023-09-18 NOTE — ED Notes (Signed)
 Report given to this RN by Elnita Maxwell, RN

## 2023-09-18 NOTE — Consult Note (Signed)
 [facility] TeleNeurology Consult Services   Patient Name:   Henry Garcia, Henry Garcia Date of Birth:   07/03/1996 Identification Number:   MRN - [id number] Date of Service:   09/18/2023 20:35:15  Diagnosis:       I63.89 - Cerebrovascular accident (CVA) due to other mechanism [facility])  Impression:      Henry Garcia is a 28 year-old man with a PMH of Wolf Parkinson White who presents with left sided weakness. TNK was not given.  Our recommendations are outlined below.  Recommendations:        Stroke/Telemetry Floor       Neuro Checks       Bedside Swallow Eval       DVT Prophylaxis       IV Fluids, Normal Saline       Head of Bed 30 Degrees       Euglycemia and Avoid Hyperthermia (PRN Acetaminophen)       Initiate or continue Aspirin 325 MG daily       MRI Brain without contrast       TTE       Labs: lipid panel; HgbA1c, ESR, CRP, TSH, Factor V Leiden, Protein C, Protein S, Antithrombin III, Anticardiolipin Abs       PT/OT/ST where applicable  Sign Out:       Discussed with Emergency Department Provider    ------------------------------------------------------------------------------  Advanced Imaging: CTA Head and Neck Completed.  CTP Completed.  LVO:No  Patient in not a candidate for NIR   Metrics: Last Known Well: 09/18/2023 19:00:00 Dispatch Time: 09/18/2023 20:35:15 Arrival Time: 09/18/2023 19:54:00 Initial Response Time: 09/18/2023 20:39:35 Symptoms: Left sided weakness. Initial patient interaction: 09/18/2023 20:56:37 NIHSS Assessment Completed: 09/18/2023 21:01:03 Patient is not a candidate for Thrombolytic. Thrombolytic Medical Decision: 09/18/2023 21:08:31 Patient was not deemed candidate for Thrombolytic because of following reasons: Patient/Family declined . Weight Noted by Staff: 65.8 kg  CT head showed no acute hemorrhage or acute core infarct.  Primary Provider Notified of Diagnostic Impression and Management Plan on: 09/18/2023  21:08:14    ------------------------------------------------------------------------------  History of Present Illness: Patient is a 28 year old Male.  Patient was brought by private transportation with symptoms of Left sided weakness. Henry Garcia is a 28 year-old man with a PMH of Wolf Parkinson White who presents with left sided weakness. He reports that he was fine all day. He states he was walking around his house putting things in storage when he felt that he was weak on his left side including his left face, arm, and leg. He states that when he walked he felt like he couldn't go in the direction he wanted to. He denies ever having had symptoms like this before.  Of note, we discussed the benefits and risk of TNK. Henry Garcia stated that he needed to talk to his wife first. He called his wife and his wife initially responded that she was not able to talk in the moment. Therefore he wanted to wait to call her back in order to decide on TNK.   Past Medical History:      There is no history of Hypertension      There is no history of Diabetes Mellitus      There is no history of Hyperlipidemia      There is no history of Atrial Fibrillation      There is no history of Stroke      There is no history of Seizures  There is no history of Dementia/MCI  Medications:  No Anticoagulant use  No Antiplatelet use Reviewed EMR for current medications  Allergies:  Reviewed Description: Cortisone, Amoxicillin  Social History: Smoking: No Alcohol Use: No Drug Use: No  Family History:  There is no family history of premature cerebrovascular disease pertinent to this consultation  ROS : 14 Points Review of Systems was performed and was negative except mentioned in HPI.  Past Surgical History: There Is No Surgical History Contributory To Today's Visit    Examination: BP(145/78), Pulse(100), Blood Glucose(93) 1A: Level of Consciousness - Alert; keenly responsive + 0 1B:  Ask Month and Age - Both Questions Right + 0 1C: Blink Eyes & Squeeze Hands - Performs Both Tasks + 0 2: Test Horizontal Extraocular Movements - Normal + 0 3: Test Visual Fields - No Visual Loss + 0 4: Test Facial Palsy (Use Grimace if Obtunded) - Normal symmetry + 0 5A: Test Left Arm Motor Drift - Drift, but doesn't hit bed + 1 5B: Test Right Arm Motor Drift - No Drift for 10 Seconds + 0 6A: Test Left Leg Motor Drift - Drift, but doesn't hit bed + 1 6B: Test Right Leg Motor Drift - No Drift for 5 Seconds + 0 7: Test Limb Ataxia (FNF/Heel-Shin) - No Ataxia + 0 8: Test Sensation - Mild-Moderate Loss: Less Sharp/More Dull + 1 9: Test Language/Aphasia - Normal; No aphasia + 0 10: Test Dysarthria - Normal + 0 11: Test Extinction/Inattention - No abnormality + 0  NIHSS Score: 3   Pre-Morbid Modified Rankin Scale: 0 Points = No symptoms at all  Spoke with : ER Physician (Dr. Larinda Buttery)  This consult was conducted in real time using interactive audio and Immunologist. Patient was informed of the technology being used for this visit and agreed to proceed. Patient located in hospital and provider located at home/office setting.   Patient is being evaluated for possible acute neurologic impairment and high probability of imminent or life-threatening deterioration. I spent total of 35 minutes providing care to this patient, including time for face to face visit via telemedicine, review of medical records, imaging studies and discussion of findings with providers, the patient and/or family.   Dr Wilford Sports   [facility] For Inpatient follow-up with [facility] physician please call [facility] at [phone]. As we are not an outpatient service for any post hospital discharge needs please contact the hospital for assistance. If you have any questions for the [facility] physicians or need to reconsult for clinical or diagnostic changes please contact us via [facility] at  [phone].

## 2023-09-18 NOTE — Assessment & Plan Note (Signed)
-  Continue Lexapro

## 2023-09-18 NOTE — ED Notes (Signed)
 Pt wanting to wait on TNK until pt talks to his wife.

## 2023-09-18 NOTE — Progress Notes (Signed)
   09/18/23 2026  Spiritual Encounters  Type of Visit Initial  Care provided to: Patient  Conversation partners present during encounter Nurse;Physician  Referral source Code page  Reason for visit Code (Code Stroke)  OnCall Visit Yes  Spiritual Framework  Presenting Themes Significant life change;Impactful experiences and emotions  Interventions  Spiritual Care Interventions Made Established relationship of care and support;Compassionate presence;Reflective listening;Normalization of emotions  Intervention Outcomes  Outcomes Connection to spiritual care;Awareness of support

## 2023-09-18 NOTE — ED Notes (Signed)
 Ed provider at bedside

## 2023-09-18 NOTE — ED Notes (Signed)
 Per Dr. Larinda Buttery call CODE STOKE, charge notified at this time

## 2023-09-18 NOTE — ED Notes (Signed)
 Called to [employer] per Charge RN Melanie/activate code stroke/rep:Kim.

## 2023-09-18 NOTE — ED Notes (Signed)
 Pt taken to CT scanner by this RN, PIV attempt failed, will attempt for 2nd IV once CT scan is complete, charge to notify teleneuro cart

## 2023-09-18 NOTE — H&P (Signed)
 History and Physical    Patient: Henry Garcia YNW:[id number] DOB: October 29, 1995 DOA: 09/18/2023 DOS: the patient was seen and examined on 09/18/2023 [facility]: [facility]  Patient coming from: Home  Chief Complaint:  Chief Complaint  Patient presents with   Weakness    HPI: Henry Garcia is a 28 y.o. male with medical history significant for Paroxysmal tachycardia with prior EP workup, on metoprolol, alcohol use disorder, in remission for the past 3 weeks, currently seeing a therapist, depression who came in as a code stroke with left-sided weakness involving face, arm and leg that started suddenly at 7 PM on 09/18/2023.  No prior history of similar symptoms.  He arrived within the tPA window.  Initial CT head negative for bleed.  He was offered TNK but declined.  CTA head and neck with no LVO.  He was seen in consultation by teleneurology.  Symptoms persist at the time of admission, NIHSS of 3.  No history of seizures. Additional ED course: On arrival vitals within normal limits Blood work including CMP, CBC and troponin unremarkable.  EtOH less than 10.Next 9 EKG, personally viewed and interpreted showing sinus rhythm at 75 with nonspecific T wave changes  Admission requested for stroke workup   Review of Systems: As mentioned in the history of present illness. All other systems reviewed and are negative.  Past Medical History:  Diagnosis Date   Heart murmur    Heart murmur    Wolff-Parkinson-White syndrome    Past Surgical History:  Procedure Laterality Date   ELECTROPHYSIOLOGIC STUDY     08/2018   HAND SURGERY Right    5th finger, 2012 ad 2013   TONSILLECTOMY     Social History:  reports that he has never smoked. He has never used smokeless tobacco. He reports current alcohol use of about 21.0 standard drinks of alcohol per week. He reports that he does not use drugs.  Allergies  Allergen Reactions   Cortisone Other (See Comments) and Shortness Of Breath     hiccups   Amoxicillin Itching    Took in combination with clarithromycin and omeprazole for h. Pylori. Developed urticaria in 4 days   Clarithromycin Itching    Took in combination with amoxicillin for h pylori. Developed rash    Family History  Problem Relation Age of Onset   Hyperlipidemia Father     Prior to Admission medications   Medication Sig Start Date End Date Taking? Authorizing Provider  escitalopram (LEXAPRO) 5 MG tablet Take 1 tablet (5 mg total) by mouth daily. 07/01/23 07/31/23  Myriam Forehand, NP  metoprolol succinate (TOPROL-XL) 25 MG 24 hr tablet Take 1 tablet (25 mg total) by mouth daily. 07/01/23 07/31/23  Myriam Forehand, NP    Physical Exam: Vitals:   09/18/23 2135 09/18/23 2245 09/18/23 2300 09/18/23 2330  BP: (!) 131/94 133/85 (!) 143/92 124/77  Pulse: 82 79 73 68  Resp: 15 15 13 15   Temp:      TempSrc:      SpO2: 99% 99% 99% 98%  Weight:      Height:       Physical Exam Vitals and nursing note reviewed.  Constitutional:      General: He is not in acute distress. HENT:     Head: Normocephalic and atraumatic.  Cardiovascular:     Rate and Rhythm: Normal rate and regular rhythm.     Heart sounds: Normal heart sounds.  Pulmonary:     Effort: Pulmonary effort  is normal.     Breath sounds: Normal breath sounds.  Abdominal:     Palpations: Abdomen is soft.     Tenderness: There is no abdominal tenderness.  Neurological:     General: No focal deficit present.     Mental Status: Mental status is at baseline.     Labs on Admission: I have personally reviewed following labs and imaging studies  CBC: Recent Labs  Lab 09/18/23 2032  WBC 7.7  NEUTROABS 5.2  HGB 14.3  HCT 42.1  MCV 88.6  PLT 299   Basic Metabolic Panel: Recent Labs  Lab 09/18/23 2032  NA 137  K 3.7  CL 99  CO2 25  GLUCOSE 110*  BUN 15  CREATININE 0.75  CALCIUM 9.6   GFR: Estimated Creatinine Clearance: 118.5 mL/min (by C-G formula based on SCr of 0.75 mg/dL). Liver  Function Tests: Recent Labs  Lab 09/18/23 2032  AST 20  ALT 14  ALKPHOS 75  BILITOT 0.9  PROT 8.4*  ALBUMIN 5.1*   No results for input(s): "LIPASE", "AMYLASE" in the last 168 hours. No results for input(s): "AMMONIA" in the last 168 hours. Coagulation Profile: Recent Labs  Lab 09/18/23 2032  INR 1.0   Cardiac Enzymes: No results for input(s): "CKTOTAL", "CKMB", "CKMBINDEX", "TROPONINI" in the last 168 hours. BNP (last 3 results) No results for input(s): "PROBNP" in the last 8760 hours. HbA1C: No results for input(s): "HGBA1C" in the last 72 hours. CBG: Recent Labs  Lab 09/18/23 2016  GLUCAP 93   Lipid Profile: No results for input(s): "CHOL", "HDL", "LDLCALC", "TRIG", "CHOLHDL", "LDLDIRECT" in the last 72 hours. Thyroid Function Tests: No results for input(s): "TSH", "T4TOTAL", "FREET4", "T3FREE", "THYROIDAB" in the last 72 hours. Anemia Panel: No results for input(s): "VITAMINB12", "FOLATE", "FERRITIN", "TIBC", "IRON", "RETICCTPCT" in the last 72 hours. Urine analysis:    Component Value Date/Time   COLORURINE YELLOW (A) 11/01/2022 1243   APPEARANCEUR CLEAR (A) 11/01/2022 1243   LABSPEC 1.017 11/01/2022 1243   PHURINE 6.0 11/01/2022 1243   GLUCOSEU NEGATIVE 11/01/2022 1243   HGBUR SMALL (A) 11/01/2022 1243   BILIRUBINUR NEGATIVE 11/01/2022 1243   KETONESUR NEGATIVE 11/01/2022 1243   PROTEINUR NEGATIVE 11/01/2022 1243   NITRITE NEGATIVE 11/01/2022 1243   LEUKOCYTESUR NEGATIVE 11/01/2022 1243    Radiological Exams on Admission: CT ANGIO HEAD NECK W WO CM W PERF (CODE STROKE) Result Date: 09/18/2023 CLINICAL DATA:  Initial evaluation for acute neuro deficit, stroke suspected. And hand section clinic on a [place] detail of Tammy on a image distended stroke on a patient SP knows of she sent me all coronal also a missing sagittal reformats on everything get again next a she EXAM: CT ANGIOGRAPHY HEAD AND NECK CT PERFUSION BRAIN TECHNIQUE: Multidetector CT imaging of the  head and neck was performed using the standard protocol during bolus administration of intravenous contrast. Multiplanar CT image reconstructions and MIPs were obtained to evaluate the vascular anatomy. Carotid stenosis measurements (when applicable) are obtained utilizing NASCET criteria, using the distal internal carotid diameter as the denominator. Multiphase CT imaging of the brain was performed following IV bolus contrast injection. Subsequent parametric perfusion maps were calculated using RAPID software. RADIATION DOSE REDUCTION: This exam was performed according to the departmental dose-optimization program which includes automated exposure control, adjustment of the mA and/or kV according to patient size and/or use of iterative reconstruction technique. CONTRAST:  OMNIPAQUE IOHEXOL 350 MG/ML SOLN COMPARISON:  CT from earlier the same day. FINDINGS: CTA NECK FINDINGS  Aortic arch: Standard branching. Imaged portion shows no evidence of aneurysm or dissection. No significant stenosis of the major arch vessel origins. Right carotid system: No evidence of dissection, stenosis (50% or greater) or occlusion. Left carotid system: No evidence of dissection, stenosis (50% or greater) or occlusion. Vertebral arteries: No evidence of dissection, stenosis (50% or greater) or occlusion. Skeleton: No discrete or worrisome osseous lesions. Other neck: No other acute finding. Upper chest: No other acute finding. Review of the MIP images confirms the above findings CTA HEAD FINDINGS Anterior circulation: Both internal carotid arteries widely patent to the termini without stenosis. A1 segments widely patent. Normal anterior communicating artery complex. Both anterior cerebral arteries widely patent to their distal aspects without stenosis. No M1 stenosis or occlusion. Normal MCA bifurcations. Distal MCA branches well perfused and symmetric. Posterior circulation: Both V4 segments patent without stenosis. Left PICA  patent. Right PICA not well seen. Basilar patent without stenosis. Superior cerebellar and posterior cerebral arteries patent bilaterally. Venous sinuses: Patent allowing for timing the contrast bolus. Anatomic variants: None significant.  No aneurysm. Review of the MIP images confirms the above findings CT Brain Perfusion Findings: ASPECTS: 10 CBF (<30%) Volume: 0mL Perfusion (Tmax>6.0s) volume: 0mL Mismatch Volume: 0mL Infarction Location:Negative CT perfusion with no evidence for acute ischemia or other perfusion abnormality. IMPRESSION: 1. Normal CTA of the head and neck. No large vessel occlusion, hemodynamically significant stenosis, or other acute vascular abnormality. 2. Negative CT perfusion with no evidence for acute ischemia or other perfusion abnormality. Electronically Signed   By: Rise Mu M.D.   On: 09/18/2023 22:13   DG Chest Portable 1 View Result Date: 09/18/2023 CLINICAL DATA:  Chest pain EXAM: PORTABLE CHEST 1 VIEW COMPARISON:  07/11/2023 FINDINGS: The heart size and mediastinal contours are within normal limits. Both lungs are clear. The visualized skeletal structures are unremarkable. IMPRESSION: No active disease. Electronically Signed   By: Alcide Clever M.D.   On: 09/18/2023 21:50   CT HEAD CODE STROKE WO CONTRAST Result Date: 09/18/2023 CLINICAL DATA:  Code stroke. Initial evaluation for neuro deficit, stroke. She EXAM: CT HEAD WITHOUT CONTRAST TECHNIQUE: Contiguous axial images were obtained from the base of the skull through the vertex without intravenous contrast. RADIATION DOSE REDUCTION: This exam was performed according to the departmental dose-optimization program which includes automated exposure control, adjustment of the mA and/or kV according to patient size and/or use of iterative reconstruction technique. COMPARISON:  Prior CT from 01/18/2023 FINDINGS: Brain: Cerebral volume within normal limits for patient age. No acute intracranial hemorrhage. No acute large  vessel territory infarct. No mass lesion, midline shift, or mass effect. Ventricles are normal in size without hydrocephalus. No extra-axial fluid collection. Vascular: No abnormal hyperdense vessel. Skull: Scalp soft tissues demonstrate no acute abnormality. Calvarium intact. Sinuses/Orbits: Globes and orbital soft tissues within normal limits. Visualized paranasal sinuses are largely clear. No significant mastoid effusion. ASPECTS [facility] Stroke Program Early CT Score) - Ganglionic level infarction (caudate, lentiform nuclei, internal capsule, insula, M1-M3 cortex): 7 - Supraganglionic infarction (M4-M6 cortex): 3 Total score (0-10 with 10 being normal): 10 IMPRESSION: 1. Normal head CT.  No acute intracranial abnormality. 2. ASPECTS is 10. These results were communicated to Dr. Selina Cooley at 8:34 pm on 09/18/2023 by text page via the [facility] messaging system. Electronically Signed   By: Rise Mu M.D.   On: 09/18/2023 20:35     Data Reviewed: Relevant notes from primary care and specialist visits, past discharge summaries as available in EHR, including  Care Everywhere. Prior diagnostic testing as pertinent to current admission diagnoses Updated medications and problem lists for reconciliation ED course, including vitals, labs, imaging, treatment and response to treatment Triage notes, nursing and pharmacy notes and ED provider's notes Notable results as noted in HPI   Assessment and Plan: * Focal neurological deficit Came in as code stroke, declined tPA Permissive hypertension for first 24-48 hrs post stroke onset: Prn Labetalol IV or Vasotec IV If BP greater than 220/120  Statins for LDL goal less than 70 ASA 81mg  daily, Plavix 75mg  daily x 3 weeks then monotherapy thereafter Telemetry, echo and MRI Avoid dextrose containing fluids, Maintain euglycemia, euthermia Neuro checks q4 hrs x 24 hrs and then per shift Head of bed 30 degrees Physical therapy/Occupational therapy/Speech therapy  if failed dysphagia screen Neurology consult to follow   Paroxysmal tachycardia (HCC) Prior EP workup at [facility] in 2020 ruled out for WPW Continue metoprolol-follows with [facility] cardiology  Alcohol use disorder, moderate, in early remission, dependence (HCC) Patient states he has been abstinent for the past 3 weeks and has been working with a therapist EtOH less than 10 Will hold off on CIWA at this time  Depression Continue Lexapro     DVT prophylaxis: Lovenox  Consults: nrurology  Advance Care Planning:   Code Status: Prior   Family Communication: none  Disposition Plan: Back to previous home environment  Severity of Illness: The appropriate patient status for this patient is OBSERVATION. Observation status is judged to be reasonable and necessary in order to provide the required intensity of service to ensure the patient's safety. The patient's presenting symptoms, physical exam findings, and initial radiographic and laboratory data in the context of their medical condition is felt to place them at decreased risk for further clinical deterioration. Furthermore, it is anticipated that the patient will be medically stable for discharge from the hospital within 2 midnights of admission.   Author: Andris Baumann, MD 09/18/2023 11:37 PM  For on call review [email or web address].

## 2023-09-18 NOTE — Assessment & Plan Note (Signed)
 Patient states he has been abstinent for the past 3 weeks and has been working with a therapist EtOH less than 10 Will hold off on CIWA at this time

## 2023-09-18 NOTE — ED Notes (Signed)
 TNK pulled at via tele cart RN request incase it is needed.

## 2023-09-18 NOTE — ED Notes (Signed)
Chaplain to bedside 

## 2023-09-18 NOTE — ED Triage Notes (Signed)
 First RN Note: Pt to ED via [facility] from home. Per EMS pt was on front steps when they arrived. Per EMS pt reports sudden onset generalized weakness, per EMS pt with noted decreased grip strength to the L vs the R hand. EMS also reported sudden onset chest pressure when pulling in to ED.   EMS reports that pt takes Metoprolol for known heart murmur.   147/86 16RR 85HR CBG 108 100% RA 98.2

## 2023-09-18 NOTE — ED Notes (Signed)
 Pt to CT

## 2023-09-18 NOTE — ED Notes (Signed)
 Pt passed swallow screen

## 2023-09-18 NOTE — ED Notes (Signed)
 Pt back from CT

## 2023-09-18 NOTE — ED Notes (Signed)
 ED Provider at bedside.

## 2023-09-18 NOTE — ED Triage Notes (Addendum)
 Pt arrived POV for generalized weakness throughout the day, gotten worse within the hours and called EMS, reports more weakness on the left hand, tingling around mouth on left side, slight left side facial droop, and weakness to left leg, reporting all over chest pain pain like pressure sensation and SOB. Denies fevers or recent alignment. Hx of ablation d/t tachycardia, Wolff-Parkinson-White syndrome and has a hear murmur. VSS, A&O x4. Pt is back and forth on when his symptoms first started, first saying this morning now saying around 7 or 8pm tonight.

## 2023-09-18 NOTE — Assessment & Plan Note (Signed)
 Prior EP workup at [facility] in 2020 ruled out for WPW Continue metoprolol-follows with [facility] cardiology

## 2023-09-18 NOTE — Progress Notes (Signed)
 2029 - Code Stroke Activated  mRS 0, LKWT 1900, Patient in CT. 2035 - Patient returned to room 2035 - Tele-Neurologist paged  2053 - Dr. Grandville Silos on stroke cart  2053 - CT head results reviewed by Dr. Grandville Silos on camera TNK declined by patient.

## 2023-09-18 NOTE — Assessment & Plan Note (Signed)
 Came in as code stroke, declined tPA Permissive hypertension for first 24-48 hrs post stroke onset: Prn Labetalol IV or Vasotec IV If BP greater than 220/120  Statins for LDL goal less than 70 ASA 81mg  daily, Plavix 75mg  daily x 3 weeks then monotherapy thereafter Telemetry, echo and MRI Avoid dextrose containing fluids, Maintain euglycemia, euthermia Neuro checks q4 hrs x 24 hrs and then per shift Head of bed 30 degrees Physical therapy/Occupational therapy/Speech therapy if failed dysphagia screen Neurology consult to follow

## 2023-09-18 NOTE — ED Notes (Addendum)
 Attempted to call pt's wife for pt. Call went to voicemail. Neurology asking pt if he would like to proceed with TNK

## 2023-09-18 NOTE — ED Notes (Addendum)
 Henry Garcia, teleneuro stroke RN active at this time, report being given by this RN while wlaking back from CT scanner to RM 2 with pt and charge RN

## 2023-09-18 NOTE — ED Notes (Signed)
 Neurology live on neuro cart.

## 2023-09-19 ENCOUNTER — Encounter: Payer: Self-pay | Admitting: Radiology

## 2023-09-19 ENCOUNTER — Observation Stay: Payer: MEDICAID

## 2023-09-19 DIAGNOSIS — G43109 Migraine with aura, not intractable, without status migrainosus: Secondary | ICD-10-CM | POA: Diagnosis not present

## 2023-09-19 DIAGNOSIS — G43809 Other migraine, not intractable, without status migrainosus: Secondary | ICD-10-CM | POA: Diagnosis not present

## 2023-09-19 LAB — HEMOGLOBIN A1C
Hgb A1c MFr Bld: 4.6 % — ABNORMAL LOW (ref 4.8–5.6)
Mean Plasma Glucose: 85.32 mg/dL

## 2023-09-19 LAB — LIPID PANEL
Cholesterol: 201 mg/dL — ABNORMAL HIGH (ref 0–200)
HDL: 100 mg/dL (ref >40)
LDL Cholesterol: 86 mg/dL (ref 0–99)
Total CHOL/HDL Ratio: 2 ratio
Triglycerides: 75 mg/dL (ref <150)
VLDL: 15 mg/dL (ref 0–40)

## 2023-09-19 LAB — HIV ANTIBODY (ROUTINE TESTING W REFLEX): HIV Screen 4th Generation wRfx: NONREACTIVE

## 2023-09-19 NOTE — ED Notes (Signed)
 Pt to MRI

## 2023-09-19 NOTE — Discharge Summary (Signed)
 Henry Garcia YQM:[id number] DOB: [birth year] DOA: 09/18/2023  PCP: [facility]  Admit date: 09/18/2023 Discharge date: 09/19/2023  Time spent: 35 minutes  Recommendations for Outpatient Follow-up:  Pcp and neurology f/u     Discharge Diagnoses:  Principal Problem:   Migraine variant Active Problems:   Focal neurological deficit   Paroxysmal tachycardia (HCC)   Alcohol use disorder, moderate, in early remission, dependence (HCC)   Depression   Discharge Condition: stable  Diet recommendation: regular  Filed Weights   09/18/23 2006  Weight: 65.8 kg    History of present illness:  From admission h and p Chevis Weisensel is a 28 y.o. male with medical history significant for Paroxysmal tachycardia with prior EP workup, on metoprolol, alcohol use disorder, in remission for the past 3 weeks, currently seeing a therapist, depression who came in as a code stroke with left-sided weakness involving face, arm and leg that started suddenly at 7 PM on 09/18/2023.  No prior history of similar symptoms.  He arrived within the tPA window.  Initial CT head negative for bleed.  He was offered TNK but declined.  CTA head and neck with no LVO.  He was seen in consultation by teleneurology.  Symptoms persist at the time of admission, NIHSS of 3.  No history of seizures. Additional ED course: On arrival vitals within normal limits Blood work including CMP, CBC and troponin unremarkable.  EtOH less than 10.Next 9 EKG, personally viewed and interpreted showing sinus rhythm at 75 with nonspecific T wave changes  Hospital Course:  Patient presents with one day paresthesias and weakness left face and left arms and legs. No significant lab abnormalities and mri brain and ct angio head/neck unremarkable. Symptoms much improved. Evaluated by neurology, judged to be complicated migraine versus functional left-sided weakness. Advising pcp and neurology f/u (outpatient referral to [facility]  neurology has been placed). Last drink 3 weeks ago, no withdrawal symptoms.   Procedures: none   Consultations: neurology  Discharge Exam: Vitals:   09/19/23 0424 09/19/23 0615  BP: 99/62 114/80  Pulse: 62 (!) 56  Resp: 16 16  Temp:  97.8 F (36.6 C)  SpO2: 100% 100%    General: NAD Cardiovascular: RRR Respiratory: CTAB Neuro: mild left sided weakness  Discharge Instructions   Discharge Instructions     Ambulatory referral to Neurology   Complete by: As directed    Diet general   Complete by: As directed    Increase activity slowly   Complete by: As directed       Allergies as of 09/19/2023       Reactions   Cortisone Other (See Comments), Shortness Of Breath   hiccups   Amoxicillin Itching   Took in combination with clarithromycin and omeprazole for h. Pylori. Developed urticaria in 4 days   Clarithromycin Itching   Took in combination with amoxicillin for h pylori. Developed rash        Medication List     TAKE these medications    acetaminophen 325 MG tablet Commonly known as: TYLENOL Take 325-650 mg by mouth every 6 (six) hours as needed for mild pain (pain score 1-3) or moderate pain (pain score 4-6).   escitalopram 5 MG tablet Commonly known as: LEXAPRO Take 1 tablet (5 mg total) by mouth daily.   ferrous sulfate 325 (65 FE) MG EC tablet Take 325 mg by mouth daily.   MAGNESIUM PO Take 1 tablet by mouth daily.   metoprolol succinate 25 MG 24  hr tablet Commonly known as: TOPROL-XL Take 1 tablet (25 mg total) by mouth daily.   multivitamin tablet Take 1 tablet by mouth daily.       Allergies  Allergen Reactions   Cortisone Other (See Comments) and Shortness Of Breath    hiccups   Amoxicillin Itching    Took in combination with clarithromycin and omeprazole for h. Pylori. Developed urticaria in 4 days   Clarithromycin Itching    Took in combination with amoxicillin for h pylori. Developed rash    Follow-up Information      [facility] Follow up.   Specialty: General Practice Contact information: [address] [place] [address] [phone]         Morene Crocker, MD Follow up.   Specialty: Neurology Why: This is the neurologist we have referred you to Contact information: [address] [place] [address] [phone]                  The results of significant diagnostics from this hospitalization (including imaging, microbiology, ancillary and laboratory) are listed below for reference.    Significant Diagnostic Studies: MR BRAIN WO CONTRAST Result Date: 09/19/2023 CLINICAL DATA:  Initial evaluation for acute neuro deficit, stroke suspected. EXAM: MRI HEAD WITHOUT CONTRAST TECHNIQUE: Multiplanar, multiecho pulse sequences of the brain and surrounding structures were obtained without intravenous contrast. COMPARISON:  CTs from 09/18/2023. FINDINGS: Brain: Cerebral volume within normal limits for age. No focal parenchymal signal abnormality. No abnormal foci of restricted diffusion to suggest acute or subacute ischemia. Gray-white matter differentiation well maintained. No encephalomalacia to suggest chronic cortical infarction or other insult. No foci of susceptibility artifact indicative of acute or chronic intracranial blood products. No mass lesion, midline shift or mass effect. Ventricles normal in size and morphology without hydrocephalus. No extra-axial fluid collection. Pituitary gland and suprasellar region within normal limits. Vascular: Major intracranial vascular flow voids are well maintained. Skull and upper cervical spine: Craniocervical junction within normal limits. Visualized upper cervical spine demonstrates no significant finding. Bone marrow signal intensity within normal limits. No scalp soft tissue abnormality. Sinuses/Orbits: Globes and orbital soft tissues are within normal limits. Paranasal sinuses are largely clear. No  significant mastoid effusion. Other: None. IMPRESSION: Normal brain MRI. No acute intracranial abnormality identified. Electronically Signed   By: Rise Mu M.D.   On: 09/19/2023 00:46   CT ANGIO HEAD NECK W WO CM W PERF (CODE STROKE) Result Date: 09/18/2023 CLINICAL DATA:  Initial evaluation for acute neuro deficit, stroke suspected. And hand section clinic on a [place] detail of Tammy on a image distended stroke on a patient SP knows of she sent me all coronal also a missing sagittal reformats on everything get again next a she EXAM: CT ANGIOGRAPHY HEAD AND NECK CT PERFUSION BRAIN TECHNIQUE: Multidetector CT imaging of the head and neck was performed using the standard protocol during bolus administration of intravenous contrast. Multiplanar CT image reconstructions and MIPs were obtained to evaluate the vascular anatomy. Carotid stenosis measurements (when applicable) are obtained utilizing NASCET criteria, using the distal internal carotid diameter as the denominator. Multiphase CT imaging of the brain was performed following IV bolus contrast injection. Subsequent parametric perfusion maps were calculated using RAPID software. RADIATION DOSE REDUCTION: This exam was performed according to the departmental dose-optimization program which includes automated exposure control, adjustment of the mA and/or kV according to patient size and/or use of iterative reconstruction technique. CONTRAST:  OMNIPAQUE IOHEXOL 350  MG/ML SOLN COMPARISON:  CT from earlier the same day. FINDINGS: CTA NECK FINDINGS Aortic arch: Standard branching. Imaged portion shows no evidence of aneurysm or dissection. No significant stenosis of the major arch vessel origins. Right carotid system: No evidence of dissection, stenosis (50% or greater) or occlusion. Left carotid system: No evidence of dissection, stenosis (50% or greater) or occlusion. Vertebral arteries: No evidence of dissection, stenosis (50% or greater) or  occlusion. Skeleton: No discrete or worrisome osseous lesions. Other neck: No other acute finding. Upper chest: No other acute finding. Review of the MIP images confirms the above findings CTA HEAD FINDINGS Anterior circulation: Both internal carotid arteries widely patent to the termini without stenosis. A1 segments widely patent. Normal anterior communicating artery complex. Both anterior cerebral arteries widely patent to their distal aspects without stenosis. No M1 stenosis or occlusion. Normal MCA bifurcations. Distal MCA branches well perfused and symmetric. Posterior circulation: Both V4 segments patent without stenosis. Left PICA patent. Right PICA not well seen. Basilar patent without stenosis. Superior cerebellar and posterior cerebral arteries patent bilaterally. Venous sinuses: Patent allowing for timing the contrast bolus. Anatomic variants: None significant.  No aneurysm. Review of the MIP images confirms the above findings CT Brain Perfusion Findings: ASPECTS: 10 CBF (<30%) Volume: 0mL Perfusion (Tmax>6.0s) volume: 0mL Mismatch Volume: 0mL Infarction Location:Negative CT perfusion with no evidence for acute ischemia or other perfusion abnormality. IMPRESSION: 1. Normal CTA of the head and neck. No large vessel occlusion, hemodynamically significant stenosis, or other acute vascular abnormality. 2. Negative CT perfusion with no evidence for acute ischemia or other perfusion abnormality. Electronically Signed   By: Rise Mu M.D.   On: 09/18/2023 22:13   DG Chest Portable 1 View Result Date: 09/18/2023 CLINICAL DATA:  Chest pain EXAM: PORTABLE CHEST 1 VIEW COMPARISON:  07/11/2023 FINDINGS: The heart size and mediastinal contours are within normal limits. Both lungs are clear. The visualized skeletal structures are unremarkable. IMPRESSION: No active disease. Electronically Signed   By: Alcide Clever M.D.   On: 09/18/2023 21:50   CT HEAD CODE STROKE WO CONTRAST Result Date:  09/18/2023 CLINICAL DATA:  Code stroke. Initial evaluation for neuro deficit, stroke. She EXAM: CT HEAD WITHOUT CONTRAST TECHNIQUE: Contiguous axial images were obtained from the base of the skull through the vertex without intravenous contrast. RADIATION DOSE REDUCTION: This exam was performed according to the departmental dose-optimization program which includes automated exposure control, adjustment of the mA and/or kV according to patient size and/or use of iterative reconstruction technique. COMPARISON:  Prior CT from 01/18/2023 FINDINGS: Brain: Cerebral volume within normal limits for patient age. No acute intracranial hemorrhage. No acute large vessel territory infarct. No mass lesion, midline shift, or mass effect. Ventricles are normal in size without hydrocephalus. No extra-axial fluid collection. Vascular: No abnormal hyperdense vessel. Skull: Scalp soft tissues demonstrate no acute abnormality. Calvarium intact. Sinuses/Orbits: Globes and orbital soft tissues within normal limits. Visualized paranasal sinuses are largely clear. No significant mastoid effusion. ASPECTS [facility] Stroke Program Early CT Score) - Ganglionic level infarction (caudate, lentiform nuclei, internal capsule, insula, M1-M3 cortex): 7 - Supraganglionic infarction (M4-M6 cortex): 3 Total score (0-10 with 10 being normal): 10 IMPRESSION: 1. Normal head CT.  No acute intracranial abnormality. 2. ASPECTS is 10. These results were communicated to Dr. Selina Cooley at 8:34 pm on 09/18/2023 by text page via the [facility] messaging system. Electronically Signed   By: Rise Mu M.D.   On: 09/18/2023 20:35    Microbiology: No results found for  this or any previous visit (from the past 240 hours).   Labs: Basic Metabolic Panel: Recent Labs  Lab 09/18/23 2032  NA 137  K 3.7  CL 99  CO2 25  GLUCOSE 110*  BUN 15  CREATININE 0.75  CALCIUM 9.6   Liver Function Tests: Recent Labs  Lab 09/18/23 2032  AST 20  ALT 14  ALKPHOS 75   BILITOT 0.9  PROT 8.4*  ALBUMIN 5.1*   No results for input(s): "LIPASE", "AMYLASE" in the last 168 hours. No results for input(s): "AMMONIA" in the last 168 hours. CBC: Recent Labs  Lab 09/18/23 2032  WBC 7.7  NEUTROABS 5.2  HGB 14.3  HCT 42.1  MCV 88.6  PLT 299   Cardiac Enzymes: No results for input(s): "CKTOTAL", "CKMB", "CKMBINDEX", "TROPONINI" in the last 168 hours. BNP: BNP (last 3 results) No results for input(s): "BNP" in the last 8760 hours.  ProBNP (last 3 results) No results for input(s): "PROBNP" in the last 8760 hours.  CBG: Recent Labs  Lab 09/18/23 2016  GLUCAP 93       Signed:  Silvano Bilis MD.  [facility] 09/19/2023, 9:36 AM

## 2023-09-19 NOTE — ED Notes (Signed)
 Pt given urinal.

## 2023-09-19 NOTE — Consult Note (Signed)
 NEUROLOGY CONSULT NOTE   Date of service: Garcia 3, 2025 Patient Name: Henry Garcia MRN:  [id number] DOB:  1995/09/29 Chief Complaint: "Left-sided weakness" Requesting Provider: Kathrynn Running, MD  History of Present Illness  Henry Garcia is a 28 y.o. 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  Reports being fine all day Sunday but then started walking around the house, putting things in storage when he felt weak on his left side including left face arm and leg.  Upon specifically asking him if this was preceded by headache, he says that he had a headache for about an hour or 2 prior to this starting and then started having these one-sided symptoms.  No prior history of similar symptoms although gets headaches off and on but not a diagnosed migraineur. Came to [facility] ER-seen by telestroke service, TNK offered but declined. Reports improvement in symptoms overnight.  MRI brain negative overnight.    LKW: 1900 hrs. on 09/18/2023 Modified rankin score: 0-Completely asymptomatic and back to baseline post- stroke IV Thrombolysis: Patient declined EVT: Exam not consistent with LVO, imaging also negative  NIHSS components Score: Comment  1a Level of Conscious 0[x] 1[] 2[] 3[]     1b LOC Questions 0[x] 1[] 2[]      1c LOC Commands 0[x] 1[] 2[]      2 Best Gaze 0[x] 1[] 2[]      3 Visual 0[x] 1[] 2[] 3[]     4 Facial Palsy 0[x] 1[] 2[] 3[]     5a Motor Arm - left 0[x] 1[] 2[] 3[] 4[] UN[]   5b Motor Arm - Right 0[x] 1[] 2[] 3[] 4[] UN[]   6a Motor Leg - Left 0[x] 1[] 2[] 3[] 4[] UN[]   6b Motor Leg - Right 0[x] 1[] 2[] 3[] 4[] UN[]   7 Limb Ataxia 0[x] 1[] 2[] 3[] UN[]    8 Sensory 0[] 1[x] 2[] UN[]     9 Best Language 0[x] 1[] 2[] 3[]     10 Dysarthria 0[x] 1[] 2[] UN[]     11  Extinct. and Inattention 0[]  1[]  2[]       TOTAL: 1      ROS  Comprehensive ROS performed and pertinent positives documented in HPI   Past History   Past Medical  History:  Diagnosis Date   Heart murmur    Heart murmur    Wolff-Parkinson-White syndrome     Past Surgical History:  Procedure Laterality Date   ELECTROPHYSIOLOGIC STUDY     08/2018   HAND SURGERY Right    5th finger, 2012 ad 2013   TONSILLECTOMY      Family History: Family History  Problem Relation Age of Onset   Hyperlipidemia Father     Social History  reports that he has never smoked. He has never used smokeless tobacco. He reports current alcohol use of about 21.0 standard drinks of alcohol per week. He reports that he does not use drugs.  Allergies  Allergen Reactions   Cortisone Other (See Comments) and Shortness Of Breath    hiccups   Amoxicillin Itching    Took in combination with clarithromycin and omeprazole for h. Pylori. Developed urticaria in 4 days   Clarithromycin Itching    Took in combination with amoxicillin for h pylori. Developed rash    Medications   Current Facility-Administered Medications:     stroke: early stages of recovery book, , Does not apply, Once, Henry Baumann, MD  acetaminophen (TYLENOL) tablet 650 mg, 650 mg, Oral, Q4H PRN **OR** acetaminophen (TYLENOL) 160 MG/5ML solution 650 mg, 650 mg, Per Tube, Q4H PRN **OR** acetaminophen (TYLENOL) suppository 650 mg, 650 mg, Rectal, Q4H PRN, Henry Baumann, MD   aspirin EC tablet 81 mg, 81 mg, Oral, Daily, Henry Garcia, Henry Pink, MD   enoxaparin (LOVENOX) injection 40 mg, 40 mg, Subcutaneous, Q24H, Henry Royal V, MD, 40 mg at 2023/10/18 2358   escitalopram (LEXAPRO) tablet 5 mg, 5 mg, Oral, Daily, Henry Baumann, MD   metoprolol succinate (TOPROL-XL) 24 hr tablet 25 mg, 25 mg, Oral, Daily, Henry Baumann, MD  Current Outpatient Medications:    acetaminophen (TYLENOL) 325 MG tablet, Take 325-650 mg by mouth every 6 (six) hours as needed for mild pain (pain score 1-3) or moderate pain (pain score 4-6)., Disp: , Rfl:    ferrous sulfate 325 (65 FE) MG EC tablet, Take 325 mg by mouth daily., Disp: ,  Rfl:    MAGNESIUM PO, Take 1 tablet by mouth daily., Disp: , Rfl:    metoprolol succinate (TOPROL-XL) 25 MG 24 hr tablet, Take 1 tablet (25 mg total) by mouth daily., Disp: 30 tablet, Rfl: 0   Multiple Vitamin (MULTIVITAMIN) tablet, Take 1 tablet by mouth daily., Disp: , Rfl:    escitalopram (LEXAPRO) 5 MG tablet, Take 1 tablet (5 mg total) by mouth daily., Disp: 30 tablet, Rfl: 0  Vitals   Vitals:   09/19/23 0100 09/19/23 0142 09/19/23 0424 09/19/23 0615  BP: 115/82 (!) 96/55 99/62 114/80  Pulse: 60 (!) 54 62 (!) 56  Resp: 14 16 16 16   Temp:  97.9 F (36.6 C)  97.8 F (36.6 C)  TempSrc:  Oral  Oral  SpO2: 99% 100% 100% 100%  Weight:      Height:        Body mass index is 24.13 kg/m.  Physical Exam   GENERAL: Awake, alert in NAD HEENT: - Normocephalic and atraumatic, dry mm, no LN++, no Thyromegally LUNGS - Clear to auscultation bilaterally with no wheezes CV - S1S2 RRR, no m/r/g, equal pulses bilaterally. ABDOMEN - Soft, nontender, nondistended with normoactive BS NEURO:  Mental Status: AA&Ox3 Speech and Language: speech is clear.  Naming, repetition, fluency, and comprehension intact. Cranial Nerves: PERRL. EOMI, visual fields full, no facial asymmetry, facial sensation intact, hearing intact, tongue/uvula/soft palate midline, normal sternocleidomastoid and trapezius muscle strength. No evidence of tongue atrophy or fibrillations Motor: No drift in any of the 4 extremities although individual muscle testing reveals 4+/5 on left upper and lower extremity in all muscle groups with some giveaway weakness. Tone: is normal and bulk is normal Sensation-  diminished on the left to multiple modalities including decreased vibratory sensation on the forehead on the left Coordination: FTN intact bilaterally, no ataxia in BLE. Gait- deferred   Labs/Imaging/Neurodiagnostic studies   CBC:  Recent Labs  Lab 10/18/2023 2032  WBC 7.7  NEUTROABS 5.2  HGB 14.3  HCT 42.1  MCV 88.6   PLT 299   Basic Metabolic Panel:  Lab Results  Component Value Date   NA 137 Oct 18, 2023   K 3.7 18-Oct-2023   CO2 25 October 18, 2023   GLUCOSE 110 (H) 10-18-2023   BUN 15 Oct 18, 2023   CREATININE 0.75 18-Oct-2023   CALCIUM 9.6 10-18-2023   GFRNONAA >60 October 18, 2023   GFRAA >60 09/19/2019   Lipid Panel:  Lab Results  Component Value Date   LDLCALC 86 09/19/2023   Urine Drug Screen:  Component Value Date/Time   LABOPIA NONE DETECTED 06/26/2023 2230   COCAINSCRNUR NONE DETECTED 06/26/2023 2230   LABBENZ NONE DETECTED 06/26/2023 2230   AMPHETMU NONE DETECTED 06/26/2023 2230   THCU NONE DETECTED 06/26/2023 2230   LABBARB NONE DETECTED 06/26/2023 2230    Alcohol Level     Component Value Date/Time   ETH <10 09/18/2023 2032   INR  Lab Results  Component Value Date   INR 1.0 09/18/2023   APTT  Lab Results  Component Value Date   APTT 31 09/18/2023    CT Head without contrast(Personally reviewed): Unremarkable  CT angio Head and Neck with contrast(Personally reviewed): Normal CTA head and neck.  Negative perfusion.  MRI Brain(Personally reviewed): Normal MRI brain.  No evidence of acute stroke or FLAIR lesions concerning for demyelination.   ASSESSMENT   Johndaniel Catlin is a 28 y.o. male presenting with left-sided weakness of sudden onset after headache, imaging in the form of MRI brain is negative for acute stroke. His exam has some mild functional components but it seems to be more of a complex migraine related presentation rather than a stroke.  His young age and lack of risk factors also argues against this being a true stroke especially in the presence of headache and somewhat of a functional component to the exam. Impression: Complicated migraine versus functional left-sided weakness.  RECOMMENDATIONS  At this time, I would not pursue full stroke workup since suspicion for stroke is low. I would treat his headache if it recurs with a migraine cocktail as  needed. I would not start him on an aspirin I would also not start him on a statin-his LDL is less than 100 and that should be fine since he did not have a stroke. I would recommend that he follow-up with an outpatient neurologist should he have recurrence of the symptoms. Plan was relayed to Dr.Wouk ______________________________________________________________________    Signed, Milon Dikes, MD [facility]

## 2023-10-10 ENCOUNTER — Telehealth: Payer: Self-pay | Admitting: Cardiology

## 2023-10-10 NOTE — Telephone Encounter (Signed)
 Pt c/o medication issue:  1. Name of Medication: Metoprolol  2. How are you currently taking this medication (dosage and times per day)?   3. Are you having a reaction (difficulty breathing--STAT)?   4. What is your medication issue?  He says he would like to change from taking Metoprolol.He says he is having problems breathing and it is making him feel weaker. He says he feels worse.

## 2023-10-10 NOTE — Telephone Encounter (Signed)
 Pt called to report he's been experiencing off and on chest tightness, having trouble breathing, and feeling fatigue that he believes is related to the metoprolol. He denies swelling but noted the symptoms occur at rest and with exertion. He denies symptoms currently and noted he stopped medication 2-3 days ago. He also noted symptoms has since resolved but can now tell he's having episodes of rapid heart rate. He reported that he does have HR readings but stated he can feel when it's raping.   Nurse schedule pt an appointment for 10/11/23 for further evaluations.

## 2023-10-11 ENCOUNTER — Ambulatory Visit: Payer: MEDICAID | Attending: Medical | Admitting: Medical

## 2023-10-11 VITALS — BP 119/79 | HR 73 | Ht 65.0 in | Wt 136.0 lb

## 2023-10-11 DIAGNOSIS — R0602 Shortness of breath: Secondary | ICD-10-CM

## 2023-10-11 DIAGNOSIS — I479 Paroxysmal tachycardia, unspecified: Secondary | ICD-10-CM | POA: Diagnosis not present

## 2023-10-11 DIAGNOSIS — R9431 Abnormal electrocardiogram [ECG] [EKG]: Secondary | ICD-10-CM | POA: Diagnosis not present

## 2023-10-11 DIAGNOSIS — F1029 Alcohol dependence with unspecified alcohol-induced disorder: Secondary | ICD-10-CM

## 2023-10-11 DIAGNOSIS — R0789 Other chest pain: Secondary | ICD-10-CM | POA: Diagnosis not present

## 2023-10-11 NOTE — Progress Notes (Signed)
 Cardiology Office Note:  .   Date:  10/11/2023  ID:  Henry Garcia, DOB [birth year], MRN [id number] PCP: [facility], [facility] Providers Cardiologist:  None     History of Present Illness: .   Henry Garcia is a 28 y.o. male with a past medical history of alcohol use, depression, anxiety, complex migraine, atypical chest pain, multiple ER trips, childhood asthma who presents for follow-up.  Patient has a prior cardiac workup at [facility] for shortness of breath.  Symptoms began in April 2019 after a chemical exposure (methylmercaptopurine).  PFTs were negative.  Echo was normal.  Patient saw EP for possible WPW, however this was ruled out.    Echo in August 2018 and June 2019 showed EF 50 to 55%, normal RV size and function.  The patient established with Henry Garcia in October 2024 reporting tachycardia.  He reported increased alcohol use.  Heart monitor showed normal sinus rhythm, average heart rate of 93 bpm, rare ectopy, triggered events associated with normal sinus rhythm/sinus tachycardia.  He saw EP in November 2024 for palpitations and short PR interval.  Plan was to consider stress test if heart monitor was unrevealing.  Today, he reports side affects from metoprolol. He feels SOB, weakness, chest pain that started a week ago. He stopped the metoprolol but has not noticed a change. He feels SOB when he sits. Chest pain is on the left described as a pressure or sharp pain. It lasts a couple minutes, but goes away on its own. No lower leg edema ,fever, chills, nausea, vomiting. Stays hydrated. He works at [employer]. He does no exercise. He drinks 2-3 alcoholic drinks daily.    Studies Reviewed: Marland Kitchen   EKG Interpretation Date/Time:  Tuesday October 11 2023 10:42:57 EDT Ventricular Rate:  73 PR Interval:  102 QRS Duration:  84 QT Interval:  348 QTC Calculation: 383 R Axis:   86  Text Interpretation: Sinus rhythm with short PR When compared with ECG of  18-Sep-2023 20:06, T wave amplitude has increased in Anterior leads Confirmed by Henry Garcia, Henry Garcia ([id number]) on 10/11/2023 10:48:53 AM    Heart monitor 04/2023 Event monitor Patch Wear Time:  13 days and 21 hours (2024-10-17T12:37:56-0400 to 2024-10-31T09:40:33-0400)   Normal sinus rhythm Patient had a min HR of 50 bpm, max HR of 192 bpm, and avg HR of 93 bpm.    Isolated SVEs were rare (<1.0%), and no SVE Couplets or SVE Triplets were present.  Isolated VEs were rare (<1.0%), and no VE Couplets or VE Triplets were present.    Patient triggered events (31) associated with normal sinus rhythm/sinus tachycardia   Signed, Henry Arbour, MD, Ph.D [facility]   Physical Exam:   VS:  BP 119/79   Pulse 73   Ht 5\' 5"  (1.651 m)   Wt 136 lb (61.7 kg)   BMI 22.63 kg/m    Wt Readings from Last 3 Encounters:  10/11/23 136 lb (61.7 kg)  09/18/23 145 lb (65.8 kg)  07/11/23 135 lb (61.2 kg)    GEN: Well nourished, well developed in no acute distress NECK: No JVD; No carotid bruits CARDIAC: RRR, no murmurs, rubs, gallops RESPIRATORY:  Clear to auscultation without rales, wheezing or rhonchi  ABDOMEN: Soft, non-tender, non-distended EXTREMITIES:  No edema; No deformity   ASSESSMENT AND PLAN: .    Chest pain/SOB/weakness Patient reports chest pain, SOB, weakness that he feels is 2/2 metoprolol. He has been on metoprolol for >1 year for tachycardia/palpitations.  He reports that chest pain/sOB occur mainly at rest. He walks all day at his job and has no symptoms with this. He stopped metoprolol last week but has not noticed a significant difference. I will order a GXT. He will remain off metoprolol and we will see him back in 1 month.   Palpitations/paroxysmal tachycardia He reports increased palpitations since being off metoprolol, which I said is expected. EKG shows NSR, 71bpm. May try other BB at follow-up.   Short PR interval EKG today shows PRI . He has had previous EPS without  inducible SVT or accessory pathway.  ETOH abuse He consumes 2-3 drinks daily. Cessation recommended.     Informed Consent   Shared Decision Making/Informed Consent The risks [chest pain, shortness of breath, cardiac arrhythmias, dizziness, blood pressure fluctuations, myocardial infarction, stroke/transient ischemic attack, and life-threatening complications (estimated to be 1 in 10,000)], benefits (risk stratification, diagnosing coronary artery disease, treatment guidance) and alternatives of an exercise tolerance test were discussed in detail with Mr. Barfoot and he agrees to proceed.     Dispo: Follow-up 1 month  Signed, Warren Lindahl David Stall, PA-C

## 2023-10-11 NOTE — Patient Instructions (Signed)
 Medication Instructions:  Your physician recommends that you continue on your current medications as directed. Please refer to the Current Medication list given to you today.   *If you need a refill on your cardiac medications before your next appointment, please call your pharmacy*   Lab Work: No labs ordered today    Testing/Procedures: Your provider has ordered a exercise tolerance test. This test will evaluate the blood supply to your heart muscle during periods of exercise and rest. For this test, you will raise your heart rate by walking on a treadmill at different levels.   you may eat a light breakfast/ lunch prior to your procedure no caffeine for 24 hours prior to your test (coffee, tea, soft drinks, or chocolate)  no smoking/ vaping for 4 hours prior to your test you may take your regular medications the day of your test bring any inhalers with you to your test wear comfortable clothing & tennis/ non-skid shoes to walk on the treadmill  This will take place at [address] [facility])  [place] [address]    Follow-Up: At [facility], you and your health needs are our priority.  As part of our continuing mission to provide you with exceptional heart care, we have created designated Provider Care Teams.  These Care Teams include your primary Cardiologist (physician) and Advanced Practice Providers (APPs -  Physician Assistants and Nurse Practitioners) who all work together to provide you with the care you need, when you need it.  We recommend signing up for the patient portal called "MyChart".  Sign up information is provided on this After Visit Summary.  MyChart is used to connect with patients for Virtual Visits (Telemedicine).  Patients are able to view lab/test results, encounter notes, upcoming appointments, etc.  Non-urgent messages can be sent to your provider as well.   To learn more about what you can do with MyChart, go to  [email or web address].    Your next appointment:   1 month(s)  Provider:   Terrilee Croak, PA-C

## 2023-10-20 ENCOUNTER — Ambulatory Visit: Payer: MEDICAID

## 2023-11-15 ENCOUNTER — Ambulatory Visit: Payer: MEDICAID | Attending: Medical | Admitting: Medical

## 2023-11-15 NOTE — Progress Notes (Deleted)
  Cardiology Office Note:  .   Date:  11/15/2023  ID:  Henry Garcia, DOB 24-Oct-1995, MRN [id number] PCP: Center, Henry [facility] Providers Cardiologist:  None {  History of Present Illness: .   Henry Garcia is a 28 y.o. male with a past medical history of alcohol use, depression, anxiety, complex migraine, atypical chest pain, multiple ER trips, childhood asthma who presents for follow-up.   Patient has a prior cardiac workup at [facility] for shortness of breath.  Symptoms began in April 2019 after a chemical exposure (methylmercaptopurine).  PFTs were negative.  Echo was normal.  Patient saw EP for possible WPW, however this was ruled out.     Echo in August 2018 and June 2019 showed EF 50 to 55%, normal RV size and function.   The patient established with Dr. Jerelene Monday in October 2024 reporting tachycardia.  He reported increased alcohol use.  Heart monitor showed normal sinus rhythm, average heart rate of 93 bpm, rare ectopy, triggered events associated with normal sinus rhythm/sinus tachycardia.  He saw EP in November 2024 for palpitations and short PR interval.  Plan was to consider stress test if heart monitor was unrevealing. The patient was seen 10/11/23 reporting chest pain, SOB, weakness from metoprolol . Symptoms were mainly at rest. He stopped metoprolol  but did not notice a significant difference. ETT was ordered, but not performed.   Today,   ROS: ***  Studies Reviewed: .        *** Risk Assessment/Calculations:   {Does this patient have ATRIAL FIBRILLATION?:[phone]} No BP recorded.  {Refresh Note OR Click here to enter BP  :1}***       Physical Exam:   VS:  There were no vitals taken for this visit.   Wt Readings from Last 3 Encounters:  10/11/23 136 lb (61.7 kg)  09/18/23 145 lb (65.8 kg)  07/11/23 135 lb (61.2 kg)    GEN: Well nourished, well developed in no acute distress NECK: No JVD; No carotid bruits CARDIAC: ***RRR, no murmurs,  rubs, gallops RESPIRATORY:  Clear to auscultation without rales, wheezing or rhonchi  ABDOMEN: Soft, non-tender, non-distended EXTREMITIES:  No edema; No deformity   ASSESSMENT AND PLAN: .   ***    {Are you ordering a CV Procedure (e.g. stress test, cath, DCCV, TEE, etc)?   Press F2        :[id number]}  Dispo: ***  Signed, Henry Wieber Rebekah Canada, PA-C

## 2024-01-17 ENCOUNTER — Other Ambulatory Visit: Payer: Self-pay

## 2024-01-17 ENCOUNTER — Encounter: Payer: Self-pay | Admitting: Emergency Medicine

## 2024-01-17 ENCOUNTER — Emergency Department
Admission: EM | Admit: 2024-01-17 | Discharge: 2024-01-17 | Disposition: A | Discharge status: Home / Self Care | Payer: MEDICAID | Attending: Emergency Medicine | Admitting: Emergency Medicine

## 2024-01-17 ENCOUNTER — Emergency Department: Payer: MEDICAID

## 2024-01-17 DIAGNOSIS — Y9259 Other trade areas as the place of occurrence of the external cause: Secondary | ICD-10-CM | POA: Diagnosis not present

## 2024-01-17 DIAGNOSIS — W228XXA Striking against or struck by other objects, initial encounter: Secondary | ICD-10-CM | POA: Diagnosis not present

## 2024-01-17 DIAGNOSIS — S63501A Unspecified sprain of right wrist, initial encounter: Secondary | ICD-10-CM | POA: Diagnosis not present

## 2024-01-17 DIAGNOSIS — Y990 Civilian activity done for income or pay: Secondary | ICD-10-CM | POA: Diagnosis not present

## 2024-01-17 DIAGNOSIS — K2900 Acute gastritis without bleeding: Secondary | ICD-10-CM | POA: Diagnosis not present

## 2024-01-17 DIAGNOSIS — S6991XA Unspecified injury of right wrist, hand and finger(s), initial encounter: Secondary | ICD-10-CM | POA: Diagnosis present

## 2024-01-17 LAB — CBC WITH DIFFERENTIAL/PLATELET
Abs Immature Granulocytes: 0.03 10*3/uL (ref 0.00–0.07)
Basophils Absolute: 0 10*3/uL (ref 0.0–0.1)
Basophils Relative: 0 %
Eosinophils Absolute: 0 10*3/uL (ref 0.0–0.5)
Eosinophils Relative: 0 %
HCT: 38.7 % — ABNORMAL LOW (ref 39.0–52.0)
Hemoglobin: 13.1 g/dL (ref 13.0–17.0)
Immature Granulocytes: 0 %
Lymphocytes Relative: 9 %
Lymphs Abs: 0.8 10*3/uL (ref 0.7–4.0)
MCH: 29.9 pg (ref 26.0–34.0)
MCHC: 33.9 g/dL (ref 30.0–36.0)
MCV: 88.4 fL (ref 80.0–100.0)
Monocytes Absolute: 0.7 10*3/uL (ref 0.1–1.0)
Monocytes Relative: 8 %
Neutro Abs: 6.9 10*3/uL (ref 1.7–7.7)
Neutrophils Relative %: 83 %
Platelets: 239 10*3/uL (ref 150–400)
RBC: 4.38 MIL/uL (ref 4.22–5.81)
RDW: 13.1 % (ref 11.5–15.5)
WBC: 8.4 10*3/uL (ref 4.0–10.5)
nRBC: 0 % (ref 0.0–0.2)

## 2024-01-17 LAB — COMPREHENSIVE METABOLIC PANEL WITH GFR
ALT: 16 U/L (ref 0–44)
AST: 22 U/L (ref 15–41)
Albumin: 3.9 g/dL (ref 3.5–5.0)
Alkaline Phosphatase: 71 U/L (ref 38–126)
Anion gap: 9 (ref 5–15)
BUN: 14 mg/dL (ref 6–20)
CO2: 24 mmol/L (ref 22–32)
Calcium: 8.9 mg/dL (ref 8.9–10.3)
Chloride: 105 mmol/L (ref 98–111)
Creatinine, Ser: 0.72 mg/dL (ref 0.61–1.24)
GFR, Estimated: >60 mL/min (ref >60)
Glucose, Bld: 101 mg/dL — ABNORMAL HIGH (ref 70–99)
Potassium: 4 mmol/L (ref 3.5–5.1)
Sodium: 138 mmol/L (ref 135–145)
Total Bilirubin: 0.6 mg/dL (ref 0.0–1.2)
Total Protein: 6.6 g/dL (ref 6.5–8.1)

## 2024-01-17 LAB — LIPASE, BLOOD: Lipase: 33 U/L (ref 11–51)

## 2024-01-17 MED ORDER — ALUM & MAG HYDROXIDE-SIMETH 200-200-20 MG/5ML PO SUSP
30.0000 mL | Freq: Once | ORAL | Status: AC
  Administered 2024-01-17: 30 mL via ORAL
  Filled 2024-01-17: qty 30

## 2024-01-17 NOTE — ED Triage Notes (Signed)
 Patient to ED via POV for right wrist injury. PT reports hurting wrist while working on a car at work. Pain radiates into hand. PT also c/o heart burn after eating co-workers food that had 5 ghost peppers. States he had vomited x2 since eating the food. NAD noted.

## 2024-01-17 NOTE — Discharge Instructions (Addendum)
Your blood work and x-ray are normal.  Please follow-up with your outpatient provider.  Please return for any new, worsening, or change in symptoms or other concerns. It was a pleasure caring for you today.

## 2024-01-17 NOTE — ED Provider Notes (Signed)
 [facility] Provider Note    Event Date/Time   First MD Initiated Contact with Patient 01/17/24 1420     (approximate)   History   Wrist Pain   HPI  Henry Garcia is a 28 y.o. male who presents today for evaluation of right wrist pain as well as vomiting after eating ghost peppers.  Patient reports that he works in a [employer] and he was pulling on something and his hand slipped and he hit it against a large bolt.  He reports that he had immediate pain to his radius.  He reports that his pain improved after a couple of minutes but he wants to be sure that his wrist is okay.  Additionally, he reports that a coworker gave him peppers, and he reports that he ate 5 ghost peppers and now has burning in his esophagus and his stomach is vomited twice.  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   Alcohol abuse 10/13/2021   Major depressive disorder, recurrent severe without psychotic features (HCC) 10/12/2021   Alcohol use disorder, moderate, in early remission, dependence (HCC) 10/12/2021          Physical Exam   Triage Vital Signs: ED Triage Vitals  Encounter Vitals Group     BP 01/17/24 1359 (!) 132/90     Girls Systolic BP Percentile --      Girls Diastolic BP Percentile --      Boys Systolic BP Percentile --      Boys Diastolic BP Percentile --      Pulse Rate 01/17/24 1359 84     Resp 01/17/24 1359 17     Temp 01/17/24 1359 98.4 F (36.9 C)     Temp Source 01/17/24 1359 Oral     SpO2 01/17/24 1359 99 %     Weight 01/17/24 1400 125 lb (56.7 kg)     Height 01/17/24 1400 5' 5 (1.651 m)     Head Circumference --      Peak  Flow --      Pain Score 01/17/24 1400 7     Pain Loc --      Pain Education --      Exclude from Growth Chart --     Most recent vital signs: Vitals:   01/17/24 1359  BP: (!) 132/90  Pulse: 84  Resp: 17  Temp: 98.4 F (36.9 C)  SpO2: 99%    Physical Exam Vitals and nursing note reviewed.  Constitutional:      General: Awake and alert. No acute distress.    Appearance: Normal appearance. The patient is normal weight.  HENT:     Head: Normocephalic and atraumatic.     Mouth: Mucous membranes are moist.  Eyes:     General: PERRL. Normal EOMs        Right eye: No discharge.        Left eye: No discharge.     Conjunctiva/sclera: Conjunctivae normal.  Cardiovascular:     Rate and Rhythm: Normal rate and regular rhythm.     Pulses: Normal pulses.  Pulmonary:     Effort: Pulmonary effort is normal. No respiratory distress.     Breath sounds: Normal  breath sounds.  Abdominal:     Abdomen is soft. There is no abdominal tenderness. No rebound or guarding. No distention. Musculoskeletal:        General: No swelling. Normal range of motion.     Cervical back: Normal range of motion and neck supple.  Skin:    General: Skin is warm and dry.     Capillary Refill: Capillary refill takes less than 2 seconds.     Findings: No rash.  Neurological:     Mental Status: The patient is awake and alert.      ED Results / Procedures / Treatments   Labs (all labs ordered are listed, but only abnormal results are displayed) Labs Reviewed  COMPREHENSIVE METABOLIC PANEL WITH GFR - Abnormal; Notable for the following components:      Result Value   Glucose, Bld 101 (*)    All other components within normal limits  CBC WITH DIFFERENTIAL/PLATELET - Abnormal; Notable for the following components:   HCT 38.7 (*)    All other components within normal limits  LIPASE, BLOOD     EKG     RADIOLOGY I independently reviewed and interpreted imaging and agree with radiologists  findings.     PROCEDURES:  Critical Care performed:   Procedures   MEDICATIONS ORDERED IN ED: Medications  alum & mag hydroxide-simeth (MAALOX/MYLANTA) 200-200-20 MG/5ML suspension 30 mL (30 mLs Oral Given 01/17/24 1440)     IMPRESSION / MDM / ASSESSMENT AND PLAN / ED COURSE  I reviewed the triage vital signs and the nursing notes.   Differential diagnosis includes, but is not limited to, tendinitis, fracture, dislocation, gastritis, peptic ulcer disease, less likely pancreatitis or cholecystitis  Patient is awake and alert, hemodynamically stable and afebrile.  His abdomen is soft and nontender throughout.  There are no deformities noted to his wrist.  He has normal 2+ radial pulse, normal range of motion of all fingers, normal grip strength.  X-ray obtained in triage is negative for any acute osseous abnormality.  He has no neurovascular compromise.  He has mild discomfort with Finkelstein's maneuver.  No snuffbox tenderness.  He was given a brace for extra support.  Also discussed rest, ice, elevation.  Given his epigastric abdominal pain, basic labs obtained and he was treated with a GI cocktail with significant improvement of his symptoms.  We discussed return precautions and outpatient follow-up.  Patient understands and agrees with plan.  Discharged in stable condition.   Patient's presentation is most consistent with acute complicated illness / injury requiring diagnostic workup.   FINAL CLINICAL IMPRESSION(S) / ED DIAGNOSES   Final diagnoses:  Sprain of right wrist, initial encounter  Acute gastritis without hemorrhage, unspecified gastritis type     Rx / DC Orders   ED Discharge Orders     None        Note:  This document was prepared using Dragon voice recognition software and may include unintentional dictation errors.   Louella Medaglia E, PA-C 01/17/24 1557    Levander Slate, MD 01/18/24 [phone]

## 2024-01-23 ENCOUNTER — Emergency Department (HOSPITAL_COMMUNITY)
Admission: EM | Admit: 2024-01-23 | Discharge: 2024-01-24 | Disposition: A | Discharge status: Home / Self Care | Payer: MEDICAID | Attending: Emergency Medicine | Admitting: Emergency Medicine

## 2024-01-23 ENCOUNTER — Other Ambulatory Visit: Payer: Self-pay

## 2024-01-23 ENCOUNTER — Emergency Department (HOSPITAL_COMMUNITY): Payer: MEDICAID

## 2024-01-23 DIAGNOSIS — R0789 Other chest pain: Secondary | ICD-10-CM | POA: Diagnosis present

## 2024-01-23 DIAGNOSIS — M25531 Pain in right wrist: Secondary | ICD-10-CM | POA: Diagnosis not present

## 2024-01-23 DIAGNOSIS — Y92410 Unspecified street and highway as the place of occurrence of the external cause: Secondary | ICD-10-CM | POA: Diagnosis not present

## 2024-01-23 DIAGNOSIS — M25551 Pain in right hip: Secondary | ICD-10-CM | POA: Insufficient documentation

## 2024-01-23 DIAGNOSIS — S2241XA Multiple fractures of ribs, right side, initial encounter for closed fracture: Secondary | ICD-10-CM | POA: Diagnosis not present

## 2024-01-23 DIAGNOSIS — T07XXXA Unspecified multiple injuries, initial encounter: Secondary | ICD-10-CM

## 2024-01-23 DIAGNOSIS — S50311A Abrasion of right elbow, initial encounter: Secondary | ICD-10-CM | POA: Insufficient documentation

## 2024-01-23 DIAGNOSIS — S40211A Abrasion of right shoulder, initial encounter: Secondary | ICD-10-CM | POA: Insufficient documentation

## 2024-01-23 LAB — I-STAT CHEM 8, ED
BUN: 14 mg/dL (ref 6–20)
Calcium, Ion: 1.09 mmol/L — ABNORMAL LOW (ref 1.15–1.40)
Chloride: 102 mmol/L (ref 98–111)
Creatinine, Ser: 0.8 mg/dL (ref 0.61–1.24)
Glucose, Bld: 122 mg/dL — ABNORMAL HIGH (ref 70–99)
HCT: 45 % (ref 39.0–52.0)
Hemoglobin: 15.3 g/dL (ref 13.0–17.0)
Potassium: 3.4 mmol/L — ABNORMAL LOW (ref 3.5–5.1)
Sodium: 138 mmol/L (ref 135–145)
TCO2: 23 mmol/L (ref 22–32)

## 2024-01-23 LAB — I-STAT CG4 LACTIC ACID, ED: Lactic Acid, Venous: 1.3 mmol/L (ref 0.5–1.9)

## 2024-01-23 MED ORDER — HYDROMORPHONE HCL 1 MG/ML IJ SOLN
1.0000 mg | Freq: Once | INTRAMUSCULAR | Status: AC
  Administered 2024-01-23: 1 mg via INTRAVENOUS
  Filled 2024-01-23: qty 1

## 2024-01-23 MED ORDER — HYDROMORPHONE HCL 1 MG/ML IJ SOLN
0.5000 mg | INTRAMUSCULAR | Status: DC | PRN
  Administered 2024-01-24: 0.5 mg via INTRAVENOUS
  Filled 2024-01-23: qty 1

## 2024-01-23 MED ORDER — SODIUM CHLORIDE 0.9 % IV BOLUS
1000.0000 mL | Freq: Once | INTRAVENOUS | Status: AC
  Administered 2024-01-24: 1000 mL via INTRAVENOUS

## 2024-01-23 NOTE — ED Provider Notes (Signed)
  EMERGENCY DEPARTMENT AT [facility] Provider Note  CSN: [id number] Arrival date & time: 01/23/24 2339  Chief Complaint(s) No chief complaint on file.  HPI Henry Garcia is a 28 y.o. 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 Authorizing Provider  acetaminophen  (TYLENOL ) 325 MG tablet Take 325-650 mg by mouth every 6 (six) hours as needed for mild pain (pain score 1-3) or moderate pain (pain score 4-6).    [provider]  ferrous sulfate 325 (65 FE) MG EC tablet Take 325 mg by mouth daily.    [provider]  MAGNESIUM  PO Take 1 tablet by mouth daily.    [provider]  Multiple Vitamin (MULTIVITAMIN) tablet Take 1 tablet by mouth daily.    [provider]                                                                                                                                    Allergies Cortisone, Amoxicillin, and Clarithromycin  Review of Systems Review of Systems As noted in HPI  Physical Exam Vital Signs  I have reviewed the triage vital signs BP 138/82   Temp (!) 97.1 F (36.2 C) (Temporal)    Ht 5' 5 (1.651 m)   Wt 61.2 kg   BMI 22.47 kg/m  *** Physical Exam  ED Results and Treatments Labs (all labs ordered are listed, but only abnormal results are displayed) Labs Reviewed  COMPREHENSIVE METABOLIC PANEL WITH GFR  CBC  ETHANOL  URINALYSIS, ROUTINE W REFLEX MICROSCOPIC  PROTIME-INR  I-STAT CHEM 8, ED  I-STAT CG4 LACTIC ACID, ED  SAMPLE TO BLOOD BANK  EKG  EKG Interpretation Date/Time:    Ventricular Rate:    PR Interval:    QRS Duration:    QT Interval:    QTC Calculation:   R Axis:      Text Interpretation:         Radiology No results found.  Medications Ordered in ED Medications  HYDROmorphone  (DILAUDID ) injection 1 mg (1 mg Intravenous Given 01/23/24 2350)   Procedures Procedures  (including critical care time) Medical Decision Making / ED Course   Medical Decision Making Amount and/or Complexity of Data Reviewed Labs: ordered. Radiology: ordered.  Risk Prescription drug management.    ***    Final Clinical Impression(s) / ED Diagnoses Final diagnoses:  None    This chart was dictated using voice recognition software.  Despite best efforts to proofread,  errors can occur which can change the documentation meaning.

## 2024-01-24 ENCOUNTER — Emergency Department (HOSPITAL_COMMUNITY): Payer: MEDICAID

## 2024-01-24 ENCOUNTER — Other Ambulatory Visit: Payer: Self-pay

## 2024-01-24 LAB — COMPREHENSIVE METABOLIC PANEL WITH GFR
ALT: 19 U/L (ref 0–44)
AST: 25 U/L (ref 15–41)
Albumin: 4.8 g/dL (ref 3.5–5.0)
Alkaline Phosphatase: 82 U/L (ref 38–126)
Anion gap: 17 — ABNORMAL HIGH (ref 5–15)
BUN: 15 mg/dL (ref 6–20)
CO2: 21 mmol/L — ABNORMAL LOW (ref 22–32)
Calcium: 9.8 mg/dL (ref 8.9–10.3)
Chloride: 100 mmol/L (ref 98–111)
Creatinine, Ser: 0.92 mg/dL (ref 0.61–1.24)
GFR, Estimated: >60 mL/min (ref >60)
Glucose, Bld: 121 mg/dL — ABNORMAL HIGH (ref 70–99)
Potassium: 3.4 mmol/L — ABNORMAL LOW (ref 3.5–5.1)
Sodium: 138 mmol/L (ref 135–145)
Total Bilirubin: 0.7 mg/dL (ref 0.0–1.2)
Total Protein: 7.8 g/dL (ref 6.5–8.1)

## 2024-01-24 LAB — ETHANOL: Alcohol, Ethyl (B): <15 mg/dL (ref <15)

## 2024-01-24 LAB — CBC
HCT: 43.1 % (ref 39.0–52.0)
Hemoglobin: 15.2 g/dL (ref 13.0–17.0)
MCH: 30.9 pg (ref 26.0–34.0)
MCHC: 35.3 g/dL (ref 30.0–36.0)
MCV: 87.6 fL (ref 80.0–100.0)
Platelets: 264 K/uL (ref 150–400)
RBC: 4.92 MIL/uL (ref 4.22–5.81)
RDW: 12.7 % (ref 11.5–15.5)
WBC: 9.2 K/uL (ref 4.0–10.5)
nRBC: 0 % (ref 0.0–0.2)

## 2024-01-24 LAB — SAMPLE TO BLOOD BANK

## 2024-01-24 LAB — PROTIME-INR
INR: 1 (ref 0.8–1.2)
Prothrombin Time: 14.2 s (ref 11.4–15.2)

## 2024-01-24 MED ORDER — OXYCODONE HCL 5 MG PO TABS: 2.5000 mg | ORAL_TABLET | Freq: Four times a day (QID) | ORAL | 0 refills | Status: DC | PRN

## 2024-01-24 MED ORDER — METHOCARBAMOL 500 MG PO TABS: 500.0000 mg | ORAL_TABLET | Freq: Three times a day (TID) | ORAL | 0 refills | Status: AC | PRN

## 2024-01-24 MED ORDER — OXYCODONE HCL 5 MG PO TABS
5.0000 mg | ORAL_TABLET | Freq: Once | ORAL | Status: AC
  Administered 2024-01-24: 5 mg via ORAL
  Filled 2024-01-24: qty 1

## 2024-01-24 MED ORDER — METHOCARBAMOL 500 MG PO TABS
1000.0000 mg | ORAL_TABLET | Freq: Once | ORAL | Status: AC
  Administered 2024-01-24: 1000 mg via ORAL
  Filled 2024-01-24: qty 2

## 2024-01-24 MED ORDER — ACETAMINOPHEN 500 MG PO TABS
1000.0000 mg | ORAL_TABLET | Freq: Once | ORAL | Status: AC
  Administered 2024-01-24: 1000 mg via ORAL
  Filled 2024-01-24: qty 2

## 2024-01-24 MED ORDER — METHOCARBAMOL 500 MG PO TABS: 500.0000 mg | ORAL_TABLET | Freq: Three times a day (TID) | ORAL | 0 refills | Status: DC | PRN

## 2024-01-24 MED ORDER — IOHEXOL 350 MG/ML SOLN
75.0000 mL | Freq: Once | INTRAVENOUS | Status: AC | PRN
  Administered 2024-01-24: 75 mL via INTRAVENOUS

## 2024-01-24 NOTE — Discharge Instructions (Addendum)
For pain control you may take 1000 mg of Tylenol every 8 hours scheduled.  In addition you can take 0.5 to 1 tablet of Oxycodone every 6 hours as needed for pain not controlled with the scheduled Tylenol.  Incentive spirometer: Use frequently to prevent pneumonia.  Recommended usage: 10 deep inhalations through spirometer every 2-3 hours for 7-10 days.   
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
## 2024-01-25 ENCOUNTER — Telehealth (HOSPITAL_COMMUNITY): Payer: Self-pay | Admitting: Emergency Medicine

## 2024-01-25 ENCOUNTER — Other Ambulatory Visit (HOSPITAL_COMMUNITY): Payer: Self-pay

## 2024-01-25 MED ORDER — OXYCODONE HCL 5 MG PO TABS: 2.5000 mg | ORAL_TABLET | Freq: Four times a day (QID) | ORAL | 0 refills | Status: AC | PRN

## 2024-01-25 NOTE — Telephone Encounter (Signed)
 Pt called regarding pharmacy his Rx was sent to was out of stock for oxycodone  and asked that it be sent to [facility].  RNCM relayed message to EDP to complete pt request.  Anelise Staron J. Debarah, BSN, RN, NCM  Transitions of Care  Nurse Case Manager  [facility] Emergency Departments  Operative Services  [phone]
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
## 2024-05-30 ENCOUNTER — Ambulatory Visit: Payer: MEDICAID | Attending: Medical

## 2024-07-21 ENCOUNTER — Other Ambulatory Visit: Payer: Self-pay

## 2024-07-21 ENCOUNTER — Emergency Department
Admission: EM | Admit: 2024-07-21 | Discharge: 2024-07-21 | Disposition: A | Discharge status: Home / Self Care | Payer: MEDICAID | Attending: Emergency Medicine | Admitting: Emergency Medicine

## 2024-07-21 DIAGNOSIS — R040 Epistaxis: Secondary | ICD-10-CM | POA: Insufficient documentation

## 2024-07-21 MED ORDER — SILVER NITRATE-POT NITRATE 75-25 % EX MISC
1.0000 | Freq: Once | CUTANEOUS | Status: DC
  Filled 2024-07-21: qty 1

## 2024-07-21 MED ORDER — ACETAMINOPHEN 325 MG PO TABS
650.0000 mg | ORAL_TABLET | Freq: Once | ORAL | Status: AC
  Administered 2024-07-21: 650 mg via ORAL
  Filled 2024-07-21: qty 2

## 2024-07-21 MED ORDER — TRANEXAMIC ACID FOR EPISTAXIS
500.0000 mg | Freq: Once | TOPICAL | Status: AC
  Administered 2024-07-21: 500 mg via TOPICAL
  Filled 2024-07-21: qty 5

## 2024-07-21 MED ORDER — OXYMETAZOLINE HCL 0.05 % NA SOLN
1.0000 | Freq: Once | NASAL | Status: AC
  Administered 2024-07-21: 1 via NASAL
  Filled 2024-07-21: qty 30

## 2024-07-21 NOTE — Discharge Instructions (Signed)
 Please follow-up with ENT.  Please try not to disrupt the scab in your nose, do not do any heavy nose blowing.  Please apply a thin layer of Vaseline to the outside of your nostril like we talked about to help humidify the air.  Please return for any new, worsening, or changing symptoms or other concerns.  It was a pleasure caring for you today.
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
## 2024-07-21 NOTE — ED Provider Notes (Signed)
 "  [facility] Provider Note    Event Date/Time   First MD Initiated Contact with Patient 07/21/24 [phone]     (approximate)   History   Epistaxis   HPI  Henry Garcia is a 29 y.o. male who presents today for evaluation of left-sided epistaxis that began overnight.  Patient reports that he has a history of nosebleeds but reports that this one has lasted longer than usual.  Denies any personal or family history of bleeding disorders.  Denies any recent illnesses.  Denies cocaine use or snorting other substances.  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   Alcohol abuse 10/13/2021   Major depressive disorder, recurrent severe without psychotic features (HCC) 10/12/2021   Alcohol use disorder, moderate, in early remission, dependence (HCC) 10/12/2021          Physical Exam   Triage Vital Signs: ED Triage Vitals [07/21/24 0709]  Encounter Vitals Group     BP 121/69     Girls Systolic BP Percentile      Girls Diastolic BP Percentile      Boys Systolic BP Percentile      Boys Diastolic BP Percentile      Pulse Rate 100     Resp 20     Temp 99 F (37.2 C)     Temp Source Oral     SpO2 98 %     Weight      Height      Head Circumference      Peak Flow      Pain Score 3     Pain Loc      Pain Education      Exclude from Growth Chart     Most recent vital signs: Vitals:   07/21/24 0709 07/21/24 0805  BP: 121/69   Pulse: 100   Resp: 20   Temp: 99 F (37.2 C)   SpO2: 98% 98%    Physical Exam Vitals and nursing note reviewed.  Constitutional:      General: Awake and alert. No acute distress.    Appearance: Normal  appearance. The patient is normal weight.  HENT:     Head: Normocephalic and atraumatic.     Mouth: Mucous membranes are moist.  Left nare with bright red blood, no brisk bleeding.  No septal perforations noted.  After suctioning, 2 sites of bleeding at anterior portion of Kiesselbach's plexus. Eyes:     General: PERRL. Normal EOMs        Right eye: No discharge.        Left eye: No discharge.     Conjunctiva/sclera: Conjunctivae normal.  Cardiovascular:     Rate and Rhythm: Normal rate and regular rhythm.     Pulses: Normal pulses.  Pulmonary:     Effort: Pulmonary effort is normal. No respiratory distress.     Breath sounds: Normal breath sounds.  Abdominal:     Abdomen is soft.  Musculoskeletal:        General: No swelling. Normal range of motion.     Cervical back: Normal range of motion and neck supple.  Skin:    General: Skin is warm and dry.     Capillary Refill:  Capillary refill takes less than 2 seconds.     Findings: No rash.  Neurological:     Mental Status: The patient is awake and alert.      ED Results / Procedures / Treatments   Labs (all labs ordered are listed, but only abnormal results are displayed) Labs Reviewed - No data to display   EKG     RADIOLOGY     PROCEDURES:  Critical Care performed:   Epistaxis Management  Date/Time: 07/21/2024 2:20 PM  Performed by: Marteze Vecchio E, PA-C Authorized by: Roizy Harold E, PA-C   Consent:    Consent obtained:  Verbal   Consent given by:  Patient   Risks, benefits, and alternatives were discussed: yes     Risks discussed:  Bleeding, infection, nasal injury and pain   Alternatives discussed:  No treatment Universal protocol:    Procedure explained and questions answered to patient or proxy's satisfaction: yes     Relevant documents present and verified: yes     Imaging studies available: yes     Required blood products, implants, devices, and special equipment available: yes     Site/side  marked: yes     Immediately prior to procedure, a time out was called: yes     Patient identity confirmed:  Verbally with patient Anesthesia:    Anesthesia method:  None Procedure details:    Treatment site:  L anterior   Treatment method:  Silver  nitrate   Treatment complexity:  Extensive   Treatment episode: initial   Post-procedure details:    Assessment:  No improvement   Procedure completion:  Tolerated well, no immediate complications    MEDICATIONS ORDERED IN ED: Medications  oxymetazoline  (AFRIN) 0.05 % nasal spray 1 spray (1 spray Each Nare Given 07/21/24 0742)  tranexamic acid  (CYKLOKAPRON ) 1000 MG/10ML topical solution 500 mg (500 mg Topical Given 07/21/24 0817)  acetaminophen  (TYLENOL ) tablet 650 mg (650 mg Oral Given 07/21/24 0856)     IMPRESSION / MDM / ASSESSMENT AND PLAN / ED COURSE  I reviewed the triage vital signs and the nursing notes.   Differential diagnosis includes, but is not limited to, epistaxis, anterior bleed, less likely posterior bleed.  Records in the past year shows normal platelets, normal H&H.  Denies easy bleeding or bruising, does not want to repeat CBC today.  Patient has obvious bleeding from the anterior Kiesselbach's plexus.  Cottonball with TXA and Afrin applied, with slowing of the bleed, though patient had pulled this out and removed the nasal clamp, and bleeding resumed.  Clamp, TXA, and Afrin were reapplied, and held for approximately 30 minutes.  This resulted in hemostasis, however patient sneezed at the time of discharge, and dislodged the clot, and patient began to rebleed.  Nasal suctioning performed with 2 visible areas of bleeding on the septal side, at the anterior Kiesselbach's plexus.  Discussed option of cautery which patient would like to proceed with.  Both bleeding areas were cauterized with good effect.  Patient was monitored for approximately 20 minutes postprocedure, no further bleeding.  Patient was discharged home with  instructions to follow-up with ENT.  Patient's presentation is most consistent with acute illness / injury with system symptoms.   Clinical Course as of 07/21/24 1421  Sat Jul 21, 2024  9183 TXA/Afrin soaked cottonball applied and held with nasal clamp [JP]  0902 Patient had removed the cottonball and clamp himself, will reapply [JP]  1027 Epistaxis has resolved.  Patient has a large scab noted  to the septal side in the left nare.  No active bleeding [JP]    Clinical Course User Index [JP] Bess Saltzman E, PA-C     FINAL CLINICAL IMPRESSION(S) / ED DIAGNOSES   Final diagnoses:  Left-sided epistaxis     Rx / DC Orders   ED Discharge Orders     None        Note:  This document was prepared using Dragon voice recognition software and may include unintentional dictation errors.   Emree Locicero E, PA-C 07/21/24 1421    Arlander Charleston, MD 07/21/24 1511  "
